# Patient Record
Sex: Male | Born: 1960
Health system: Southern US, Community
[De-identification: ages and names within clinical notes are randomized; demographics above are authoritative.]

## PROBLEM LIST (undated history)

## (undated) DIAGNOSIS — L0390 Cellulitis, unspecified: Secondary | ICD-10-CM

## (undated) DIAGNOSIS — C801 Malignant (primary) neoplasm, unspecified: Secondary | ICD-10-CM

## (undated) DIAGNOSIS — R748 Abnormal levels of other serum enzymes: Secondary | ICD-10-CM

## (undated) DIAGNOSIS — I82409 Acute embolism and thrombosis of unspecified deep veins of unspecified lower extremity: Secondary | ICD-10-CM

## (undated) DIAGNOSIS — R42 Dizziness and giddiness: Secondary | ICD-10-CM

## (undated) DIAGNOSIS — R011 Cardiac murmur, unspecified: Secondary | ICD-10-CM

## (undated) DIAGNOSIS — J189 Pneumonia, unspecified organism: Secondary | ICD-10-CM

## (undated) DIAGNOSIS — H269 Unspecified cataract: Secondary | ICD-10-CM

## (undated) DIAGNOSIS — H33319 Horseshoe tear of retina without detachment, unspecified eye: Secondary | ICD-10-CM

## (undated) DIAGNOSIS — N390 Urinary tract infection, site not specified: Secondary | ICD-10-CM

## (undated) DIAGNOSIS — N200 Calculus of kidney: Secondary | ICD-10-CM

## (undated) DIAGNOSIS — IMO0002 Reserved for concepts with insufficient information to code with codable children: Secondary | ICD-10-CM

## (undated) DIAGNOSIS — H409 Unspecified glaucoma: Secondary | ICD-10-CM

## (undated) HISTORY — DX: Horseshoe tear of retina without detachment, unspecified eye: H33.319

## (undated) HISTORY — DX: Unspecified glaucoma: H40.9

## (undated) HISTORY — DX: Acute embolism and thrombosis of unspecified deep veins of unspecified lower extremity: I82.409

## (undated) HISTORY — DX: Reserved for concepts with insufficient information to code with codable children: IMO0002

## (undated) HISTORY — DX: Urinary tract infection, site not specified: N39.0

## (undated) HISTORY — DX: Unspecified cataract: H26.9

## (undated) HISTORY — PX: EYE SURGERY: SHX253

## (undated) HISTORY — PX: TONSILLECTOMY: SUR1361

## (undated) HISTORY — DX: Dizziness and giddiness: R42

## (undated) HISTORY — DX: Calculus of kidney: N20.0

## (undated) HISTORY — DX: Cellulitis, unspecified: L03.90

## (undated) HISTORY — PX: RETINAL TEAR REPAIR CRYOTHERAPY: SHX5304

---
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
## 2000-12-24 ENCOUNTER — Encounter: Payer: Self-pay | Admitting: Emergency Medicine

## 2000-12-24 ENCOUNTER — Emergency Department (HOSPITAL_COMMUNITY): Admission: EM | Admit: 2000-12-24 | Discharge: 2000-12-24 | Payer: Self-pay | Admitting: Emergency Medicine

## 2001-01-22 ENCOUNTER — Encounter: Payer: Self-pay | Admitting: Emergency Medicine

## 2001-01-22 ENCOUNTER — Emergency Department (HOSPITAL_COMMUNITY): Admission: EM | Admit: 2001-01-22 | Discharge: 2001-01-22 | Payer: Self-pay | Admitting: Emergency Medicine

## 2002-04-08 ENCOUNTER — Encounter (HOSPITAL_BASED_OUTPATIENT_CLINIC_OR_DEPARTMENT_OTHER): Admission: RE | Admit: 2002-04-08 | Discharge: 2002-07-07 | Payer: Self-pay | Admitting: Internal Medicine

## 2002-07-08 ENCOUNTER — Encounter (HOSPITAL_BASED_OUTPATIENT_CLINIC_OR_DEPARTMENT_OTHER): Admission: RE | Admit: 2002-07-08 | Discharge: 2002-10-06 | Payer: Self-pay

## 2002-11-21 ENCOUNTER — Emergency Department (HOSPITAL_COMMUNITY): Admission: EM | Admit: 2002-11-21 | Discharge: 2002-11-21 | Payer: Self-pay | Admitting: Emergency Medicine

## 2003-04-23 ENCOUNTER — Encounter
Admission: RE | Admit: 2003-04-23 | Discharge: 2003-07-22 | Payer: Self-pay | Admitting: Physical Medicine & Rehabilitation

## 2004-04-13 ENCOUNTER — Encounter
Admission: RE | Admit: 2004-04-13 | Discharge: 2004-07-12 | Payer: Self-pay | Admitting: Physical Medicine & Rehabilitation

## 2004-05-05 ENCOUNTER — Other Ambulatory Visit: Payer: Self-pay

## 2004-05-09 ENCOUNTER — Other Ambulatory Visit: Payer: Self-pay

## 2004-08-11 ENCOUNTER — Encounter
Admission: RE | Admit: 2004-08-11 | Discharge: 2004-10-30 | Payer: Self-pay | Admitting: Physical Medicine & Rehabilitation

## 2004-08-14 ENCOUNTER — Ambulatory Visit: Payer: Self-pay | Admitting: Physical Medicine & Rehabilitation

## 2004-10-30 ENCOUNTER — Encounter
Admission: RE | Admit: 2004-10-30 | Discharge: 2005-01-25 | Payer: Self-pay | Admitting: Physical Medicine & Rehabilitation

## 2005-01-25 ENCOUNTER — Encounter
Admission: RE | Admit: 2005-01-25 | Discharge: 2005-04-25 | Payer: Self-pay | Admitting: Physical Medicine & Rehabilitation

## 2005-01-29 ENCOUNTER — Ambulatory Visit: Payer: Self-pay | Admitting: Physical Medicine & Rehabilitation

## 2005-04-23 ENCOUNTER — Encounter
Admission: RE | Admit: 2005-04-23 | Discharge: 2005-07-22 | Payer: Self-pay | Admitting: Physical Medicine & Rehabilitation

## 2005-04-23 ENCOUNTER — Ambulatory Visit: Payer: Self-pay | Admitting: Physical Medicine & Rehabilitation

## 2005-04-25 ENCOUNTER — Ambulatory Visit (HOSPITAL_COMMUNITY)
Admission: RE | Admit: 2005-04-25 | Discharge: 2005-04-25 | Payer: Self-pay | Admitting: Physical Medicine & Rehabilitation

## 2005-06-21 ENCOUNTER — Ambulatory Visit: Payer: Self-pay | Admitting: Physical Medicine & Rehabilitation

## 2005-09-04 ENCOUNTER — Encounter: Admission: RE | Admit: 2005-09-04 | Discharge: 2005-09-04 | Payer: Self-pay | Admitting: Internal Medicine

## 2005-09-07 ENCOUNTER — Ambulatory Visit (HOSPITAL_COMMUNITY): Admission: RE | Admit: 2005-09-07 | Discharge: 2005-09-07 | Payer: Self-pay | Admitting: Neurosurgery

## 2005-12-10 ENCOUNTER — Encounter
Admission: RE | Admit: 2005-12-10 | Discharge: 2006-03-10 | Payer: Self-pay | Admitting: Physical Medicine & Rehabilitation

## 2005-12-10 ENCOUNTER — Ambulatory Visit: Payer: Self-pay | Admitting: Physical Medicine & Rehabilitation

## 2006-01-21 ENCOUNTER — Ambulatory Visit: Payer: Self-pay | Admitting: Physical Medicine & Rehabilitation

## 2006-04-22 ENCOUNTER — Ambulatory Visit: Payer: Self-pay | Admitting: Physical Medicine & Rehabilitation

## 2006-04-22 ENCOUNTER — Encounter
Admission: RE | Admit: 2006-04-22 | Discharge: 2006-07-21 | Payer: Self-pay | Admitting: Physical Medicine & Rehabilitation

## 2006-05-24 ENCOUNTER — Ambulatory Visit: Payer: Self-pay | Admitting: Physical Medicine & Rehabilitation

## 2006-09-16 ENCOUNTER — Ambulatory Visit: Payer: Self-pay | Admitting: Physical Medicine & Rehabilitation

## 2006-09-16 ENCOUNTER — Encounter
Admission: RE | Admit: 2006-09-16 | Discharge: 2006-12-15 | Payer: Self-pay | Admitting: Physical Medicine & Rehabilitation

## 2006-12-13 ENCOUNTER — Ambulatory Visit: Payer: Self-pay | Admitting: Physical Medicine & Rehabilitation

## 2006-12-13 ENCOUNTER — Encounter
Admission: RE | Admit: 2006-12-13 | Discharge: 2007-03-13 | Payer: Self-pay | Admitting: Physical Medicine & Rehabilitation

## 2007-03-28 ENCOUNTER — Encounter
Admission: RE | Admit: 2007-03-28 | Discharge: 2007-06-26 | Payer: Self-pay | Admitting: Physical Medicine & Rehabilitation

## 2007-03-28 ENCOUNTER — Ambulatory Visit: Payer: Self-pay | Admitting: Physical Medicine & Rehabilitation

## 2007-08-06 ENCOUNTER — Ambulatory Visit: Payer: Self-pay | Admitting: Physical Medicine & Rehabilitation

## 2007-08-06 ENCOUNTER — Encounter
Admission: RE | Admit: 2007-08-06 | Discharge: 2007-08-08 | Payer: Self-pay | Admitting: Physical Medicine & Rehabilitation

## 2008-08-19 ENCOUNTER — Encounter
Admission: RE | Admit: 2008-08-19 | Discharge: 2008-08-20 | Payer: Self-pay | Admitting: Physical Medicine & Rehabilitation

## 2008-08-20 ENCOUNTER — Ambulatory Visit: Payer: Self-pay | Admitting: Physical Medicine & Rehabilitation

## 2010-01-16 ENCOUNTER — Encounter
Admission: RE | Admit: 2010-01-16 | Discharge: 2010-01-18 | Payer: Self-pay | Admitting: Physical Medicine & Rehabilitation

## 2010-01-18 ENCOUNTER — Ambulatory Visit: Payer: Self-pay | Admitting: Physical Medicine & Rehabilitation

## 2010-02-09 ENCOUNTER — Encounter: Admission: RE | Admit: 2010-02-09 | Discharge: 2010-05-10 | Payer: Self-pay | Admitting: Internal Medicine

## 2011-02-05 ENCOUNTER — Ambulatory Visit: Payer: Self-pay | Admitting: Physical Medicine & Rehabilitation

## 2011-02-14 ENCOUNTER — Encounter: Payer: Medicare Other | Attending: Physical Medicine & Rehabilitation

## 2011-02-14 ENCOUNTER — Ambulatory Visit: Payer: Medicare Other | Admitting: Physical Medicine & Rehabilitation

## 2011-02-14 DIAGNOSIS — M542 Cervicalgia: Secondary | ICD-10-CM | POA: Insufficient documentation

## 2011-02-14 DIAGNOSIS — M7710 Lateral epicondylitis, unspecified elbow: Secondary | ICD-10-CM

## 2011-02-14 DIAGNOSIS — X58XXXS Exposure to other specified factors, sequela: Secondary | ICD-10-CM | POA: Insufficient documentation

## 2011-02-14 DIAGNOSIS — I959 Hypotension, unspecified: Secondary | ICD-10-CM | POA: Insufficient documentation

## 2011-02-14 DIAGNOSIS — S14115A Complete lesion at C5 level of cervical spinal cord, initial encounter: Secondary | ICD-10-CM

## 2011-02-14 DIAGNOSIS — H269 Unspecified cataract: Secondary | ICD-10-CM | POA: Insufficient documentation

## 2011-02-14 DIAGNOSIS — IMO0002 Reserved for concepts with insufficient information to code with codable children: Secondary | ICD-10-CM | POA: Insufficient documentation

## 2011-02-14 DIAGNOSIS — G5620 Lesion of ulnar nerve, unspecified upper limb: Secondary | ICD-10-CM

## 2011-03-19 NOTE — Assessment & Plan Note (Signed)
He is back regarding his prior spinal cord injury and annual followup. He is generally been doing pretty well.  He had concerns about his blood pressure that has been lower over the last few months.  His blood pressure today was 77/42.  He has had no changes in his medications.  He has no complaints of dizziness or lightheadedness.  Dr. Chilton Si has checked him out as well.  His elbows have been generally stable, and he works on position changes and postural adjustments.  He has got a new power wheelchair.  Since I last saw him, he has had a lot of problems with the motor and other issues with his chair.  He had a complaint of some chest pain for a period of time that seemed to be backing off a bit.  He said Dr. Chilton Si felt it may be related to a sternal/rib problem. He was given instructions to use some heat and Tylenol.  He feels that it has gotten better over the last few months.  REVIEW OF SYSTEMS:  Notable for the above.  Full 12-point review is in the written health and history section of the chart.  The patient does follow up regularly with Dr. McDiarmid for renal ultrasound.  SOCIAL HISTORY:  The patient is single, living with his brother.  PHYSICAL EXAMINATION:  Blood pressure is 77/42, pulse 96, respiratory rate 18, satting 96% on room air.  The patient is pleasant, alert, and oriented x3.  Affect showing bright and appropriate.  Posture is fair. Sitting with some recline in his power chair.  He has still poor truncal control.  I palpated around his sternum and into the manubrium, and there was some general tenderness, but it was not severe.  He remains impaired to light touch below the level of his injury, but he seems to have some sparing along the chest.  Tone is still under good control, and there is no resting tone appreciated.  Reflexes are 2+.  ASSESSMENT: 1. C6 spinal cord injury, complete. 2. Right lateral epicondylitis and bilateral ulnar nerve irritation. 3.  Cataracts. 4. Cervicalgia history, which is likely postural and myofascial in     nature more likely.  No consistent nerve signs today. 5. Hypotension related to cord injury.  PLAN: 1. I reassured the patient that blood pressure was normal.  If he     should experience any dizziness or lightheadedness, then we could     look for other sources.  I did encourage him to drink plenty of     fluids. 2. Regular followup as scheduled with Urology. 3. Recommended ice and ibuprofen as needed for sternal pain.  This may     be a mild costochondritis.  It is improving regardless. 4. Lyrica, baclofen, and Valium on board. 5. We will see him back in about a year.  He will call me with any     problems or questions.     Ranelle Oyster, M.D. Electronically Signed    ZTS/MedQ D:  02/14/2011 14:20:14  T:  02/14/2011 22:33:13  Job #:  [id number]  cc:   Erskine Speed, M.D. Fax: [phone]
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
## 2011-04-03 NOTE — Assessment & Plan Note (Signed)
HISTORY:  [name] is here on 04/01/2007 regarding his spinal cord  injury.  He received a new left sided elbow brace for protection of the  elbow and assist with transfers.  He is still having some struggles with  it as he finds it a bit bulky and heavy at times.  It is a hinged brace.  He denies any increase in his pain symptoms in the elbows or hands.  We  last performed nerve conduction study on him in July of 2007, which  revealed ulnar neuropathy bilaterally.  Patient rates his discomfort at  a 3 out of 10.  Continues on his Lyrica, baclofen and valium for pain  and spasticity control.  No other changes have been reported.   REVIEW OF SYSTEMS:  There is no specific changes noted today.  Full  review is in the ___________ section.   SOCIAL HISTORY:  Patient is single and living with is brother.   PHYSICAL EXAMINATION:  VITAL SIGNS:  Blood pressure 82/33, pulse 92,  respiratory rate 16, he is on 94% on room air.  GENERAL:  Patient is generally pleasant in no acute distress.  MUSCULOSKELETAL:  He has some hypersensitivity on the elbows.  Rotator  cuff maneuvers were equivocal on both shoulders.  He has a head forward  posture with significant flexion of the thoracic and lower cervical  spine.  Skin is generally intact.  Brace allows full flexion and  extension in an AP plane.  NEUROLOGICAL:  Generally stable related to his C6 injury.   ASSESSMENT:  1. Status post C6 spinal cord injury.  2. Right lateral epicondylitis.  3. Bilateral ulnar neuropathy at the elbows.  4. History of cataracts.   PLAN:  1. Continue orthotics, range of motion and stretching.  I see no major      differences today in his exam.  We will hold off on nerve      conduction studies until the fall.  2. Continue Lyrica, baclofen and valium as he has been doing.  3. Recommended head rest or back rest extension to improve sitting      posture.  Some of his shoulder and arm pain may be affected by his  poor posture that is worsens during the day due to truncal      weakness.  4. I will see him back in September.      Ranelle Oyster, M.D.  Electronically Signed     ZTS/MedQ  D:  04/01/2007 11:38:38  T:  04/01/2007 12:11:46  Job #:  [id number]   cc:   Elmore Guise, MD  fax: [phone]
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
## 2012-02-06 ENCOUNTER — Encounter: Payer: Medicare Other | Attending: Physical Medicine & Rehabilitation | Admitting: Physical Medicine & Rehabilitation

## 2012-02-06 ENCOUNTER — Encounter: Payer: Self-pay | Admitting: Physical Medicine & Rehabilitation

## 2012-02-06 VITALS — BP 80/40 | HR 71 | Resp 16 | Ht 72.0 in | Wt 160.0 lb

## 2012-02-06 DIAGNOSIS — M7711 Lateral epicondylitis, right elbow: Secondary | ICD-10-CM

## 2012-02-06 DIAGNOSIS — R209 Unspecified disturbances of skin sensation: Secondary | ICD-10-CM | POA: Insufficient documentation

## 2012-02-06 DIAGNOSIS — S14106A Unspecified injury at C6 level of cervical spinal cord, initial encounter: Secondary | ICD-10-CM

## 2012-02-06 DIAGNOSIS — M75101 Unspecified rotator cuff tear or rupture of right shoulder, not specified as traumatic: Secondary | ICD-10-CM

## 2012-02-06 DIAGNOSIS — S5400XA Injury of ulnar nerve at forearm level, unspecified arm, initial encounter: Secondary | ICD-10-CM

## 2012-02-06 DIAGNOSIS — S14105A Unspecified injury at C5 level of cervical spinal cord, initial encounter: Secondary | ICD-10-CM

## 2012-02-06 DIAGNOSIS — K592 Neurogenic bowel, not elsewhere classified: Secondary | ICD-10-CM | POA: Insufficient documentation

## 2012-02-06 DIAGNOSIS — S5401XA Injury of ulnar nerve at forearm level, right arm, initial encounter: Secondary | ICD-10-CM

## 2012-02-06 DIAGNOSIS — N319 Neuromuscular dysfunction of bladder, unspecified: Secondary | ICD-10-CM | POA: Insufficient documentation

## 2012-02-06 DIAGNOSIS — M7710 Lateral epicondylitis, unspecified elbow: Secondary | ICD-10-CM

## 2012-02-06 DIAGNOSIS — IMO0002 Reserved for concepts with insufficient information to code with codable children: Secondary | ICD-10-CM | POA: Insufficient documentation

## 2012-02-06 DIAGNOSIS — M67919 Unspecified disorder of synovium and tendon, unspecified shoulder: Secondary | ICD-10-CM | POA: Insufficient documentation

## 2012-02-06 DIAGNOSIS — X58XXXS Exposure to other specified factors, sequela: Secondary | ICD-10-CM | POA: Insufficient documentation

## 2012-02-06 DIAGNOSIS — M719 Bursopathy, unspecified: Secondary | ICD-10-CM | POA: Insufficient documentation

## 2012-02-06 MED ORDER — DICLOFENAC SODIUM 1 % TD GEL: 1.0000 "application " | Freq: Three times a day (TID) | TRANSDERMAL | Status: DC

## 2012-02-06 NOTE — Progress Notes (Signed)
  Subjective:    Patient ID: Gary Burch, male    DOB: 12/14/1960, 51 y.o.   MRN: [id number]  HPI Mr. Pavey is back regarding his C6 spinal cord injury.  Bowel and bladder are stable. He just saw Dr. Iona Coach. Right elbow remains problematic.  He has a lot of the tingling and burning still especially when he's asleep.  Shoulder has bothered him at night and in the morning when he first awakens.  It bothers over the lateral shoulder and posteriorly.   Pain Inventory Average Pain 4 Pain Right Now 3 My pain is constant, sharp, burning and aching  In the last 24 hours, has pain interfered with the following? General activity 3 Relation with others 2 Enjoyment of life 3 What TIME of day is your pain at its worst? morning Sleep (in general) Fair  Pain is worse with: some activites Pain improves with: rest and heat/ice Relief from Meds: 2  Mobility use a wheelchair  Function disabled: date disabled 7 I need assistance with the following:  dressing, bathing, toileting, meal prep, household duties and shopping  Neuro/Psych spasms and issues related to quadraplegia  Prior Studies Any changes since last visit?  no  Physicians involved in your care Any changes since last visit?  no Primary care Dr Nila Nephew Dr Lorin Picket McDarmid Urologist      Review of Systems  All other systems reviewed and are negative.       Objective:   Physical Exam  Constitutional: He is oriented to person, place, and time. He appears well-developed and well-nourished.  HENT:  Head: Normocephalic and atraumatic.  Eyes: Conjunctivae and EOM are normal.  Cardiovascular: Normal rate.   Pulmonary/Chest: Effort normal and breath sounds normal.  Abdominal: Soft.  Musculoskeletal:       He has mild pain in the right shoulder with anterior palpation and posterior palpation rotator cuff maneuvers were equivocal or positive.  He has pain at the medial and lateral epicondyles of the elbow. Tinel's  test is positive at the medial condyle/ulnar groove.  Neurological: He is alert and oriented to person, place, and time.       Exam is unchanged with weakness below the C6 level. He is sensory complete. Reflexes are 2+.  Psychiatric: He has a normal mood and affect. His behavior is normal. Judgment and thought content normal.          Assessment & Plan:   1. C6 spinal cord injury, complete. 2. Right lateral epicondylitis with ulnar nerve irritation 3. Right rotator cuff syndrome 4. Neurogenic bowel and bladder  Plan: 1. We'll try voltaren gel for his right shoulder and elbow. 2. Discussed adaptive sleeping positions. He really needs to try to stay off of his side which will irritate both his shoulder and the elbow. 3. Discussed using heat elbow pad at nighttime perhaps 1 it's a bit stiffer to keep his arm in more of an extended position. 4. I'll see the patient back in about 12 months 5. Maintain current bowel and bladder schedule. He seems to be doing well there.

## 2012-02-06 NOTE — Patient Instructions (Signed)
Consider elbow pad for use at night while you sleep.  Modify sleeping position as possible to where your more supine

## 2013-01-17 DIAGNOSIS — L0390 Cellulitis, unspecified: Secondary | ICD-10-CM

## 2013-01-17 HISTORY — DX: Cellulitis, unspecified: L03.90

## 2013-02-09 ENCOUNTER — Encounter: Payer: Medicare Other | Attending: Physical Medicine & Rehabilitation | Admitting: Physical Medicine & Rehabilitation

## 2013-02-09 ENCOUNTER — Encounter: Payer: Self-pay | Admitting: Physical Medicine & Rehabilitation

## 2013-02-09 ENCOUNTER — Other Ambulatory Visit: Payer: Self-pay | Admitting: *Deleted

## 2013-02-09 VITALS — BP 103/62 | HR 57 | Resp 15 | Ht 72.0 in | Wt 170.0 lb

## 2013-02-09 DIAGNOSIS — X58XXXS Exposure to other specified factors, sequela: Secondary | ICD-10-CM | POA: Insufficient documentation

## 2013-02-09 DIAGNOSIS — N319 Neuromuscular dysfunction of bladder, unspecified: Secondary | ICD-10-CM | POA: Insufficient documentation

## 2013-02-09 DIAGNOSIS — K592 Neurogenic bowel, not elsewhere classified: Secondary | ICD-10-CM | POA: Insufficient documentation

## 2013-02-09 DIAGNOSIS — M719 Bursopathy, unspecified: Secondary | ICD-10-CM | POA: Insufficient documentation

## 2013-02-09 DIAGNOSIS — M7710 Lateral epicondylitis, unspecified elbow: Secondary | ICD-10-CM | POA: Insufficient documentation

## 2013-02-09 DIAGNOSIS — S5401XD Injury of ulnar nerve at forearm level, right arm, subsequent encounter: Secondary | ICD-10-CM

## 2013-02-09 DIAGNOSIS — M75101 Unspecified rotator cuff tear or rupture of right shoulder, not specified as traumatic: Secondary | ICD-10-CM

## 2013-02-09 DIAGNOSIS — L89209 Pressure ulcer of unspecified hip, unspecified stage: Secondary | ICD-10-CM | POA: Insufficient documentation

## 2013-02-09 DIAGNOSIS — IMO0002 Reserved for concepts with insufficient information to code with codable children: Secondary | ICD-10-CM | POA: Insufficient documentation

## 2013-02-09 DIAGNOSIS — L8990 Pressure ulcer of unspecified site, unspecified stage: Secondary | ICD-10-CM | POA: Insufficient documentation

## 2013-02-09 DIAGNOSIS — Z5189 Encounter for other specified aftercare: Secondary | ICD-10-CM

## 2013-02-09 DIAGNOSIS — R209 Unspecified disturbances of skin sensation: Secondary | ICD-10-CM | POA: Insufficient documentation

## 2013-02-09 DIAGNOSIS — M7711 Lateral epicondylitis, right elbow: Secondary | ICD-10-CM

## 2013-02-09 DIAGNOSIS — M67919 Unspecified disorder of synovium and tendon, unspecified shoulder: Secondary | ICD-10-CM | POA: Insufficient documentation

## 2013-02-09 DIAGNOSIS — S14106D Unspecified injury at C6 level of cervical spinal cord, subsequent encounter: Secondary | ICD-10-CM

## 2013-02-09 MED ORDER — TRAMADOL HCL 50 MG PO TABS: 25.0000 mg | ORAL_TABLET | Freq: Four times a day (QID) | ORAL | Status: DC | PRN

## 2013-02-09 MED ORDER — DOXAZOSIN MESYLATE 4 MG PO TABS: 6.0000 mg | ORAL_TABLET | Freq: Every day | ORAL | Status: DC

## 2013-02-09 NOTE — Patient Instructions (Signed)
RECORD YOUR URINE VOLUMES THROUGH THE CONDOM CATHETER AND VIA THE STRAIGHT CATHS. SEE IF THE INCREASE IN CARDURA LOWERS YOUR CATH AMOUNTS AND INCREASES THE VOLUME COLLECTED VIA THE CONDOM CATH

## 2013-02-09 NOTE — Progress Notes (Signed)
Subjective:    Patient ID: Gary Burch, male    DOB: [birth year], 52 y.o.   MRN: [id number]  HPI  Mr. Holroyd is back regarding his C6 SCI. Over the last couple months he's had problems with UTI's, the last of which was resistant to oral abx. He also developed a cellulitis which he just completed abx for. Also, he devloped a pressure sore on his right trochanter from side lying too much on it.   He complains of increased tingling in his hand/arm as well over this period of time. His symptoms tend to be more severe at night and first thing in the morning. They will often feel better once he's up a few hours.   Right now he is cathing twice daily and has a condom cath the other parts of the day. His cath volumes have been around 700 typically.    Pain Inventory Average Pain 4 Pain Right Now 2 My pain is intermittent and sharp  In the last 24 hours, has pain interfered with the following? General activity 3 Relation with others 1 Enjoyment of life 2 What TIME of day is your pain at its worst? morning Sleep (in general) n/a  Pain is worse with: other:sleep Pain improves with: n/a Relief from Meds: 2  Mobility do you drive?  no use a wheelchair needs help with transfers  Function not employed: date last employed 1981 disabled: date disabled 73 I need assistance with the following:  dressing, bathing, toileting, meal prep, household duties and shopping  Neuro/Psych No problems in this area  Prior Studies Any changes since last visit?  no  Physicians involved in your care Any changes since last visit?  no   History reviewed. No pertinent family history. History   Social History  . Marital Status: Single    Spouse Name: N/A    Number of Children: N/A  . Years of Education: N/A   Social History Main Topics  . Smoking status: Never Smoker   . Smokeless tobacco: Never Used  . Alcohol Use: None  . Drug Use: None  . Sexually Active: None   Other Topics Concern   . None   Social History Narrative  . None   Past Surgical History  Procedure Laterality Date  . Eye surgery      bilateral cataract  . Spine surgery  1981    fusion   Past Medical History  Diagnosis Date  . Spine injury   . Cellulitis 01-2013    left leg   BP 103/62  Pulse 57  Resp 15  Ht 6' (1.829 m)  Wt 170 lb (77.111 kg)  BMI 23.05 kg/m2  SpO2 99%      Review of Systems  Skin: Positive for rash.  All other systems reviewed and are negative.       Objective:   Physical Exam Constitutional: He is oriented to person, place, and time. He appears well-developed and well-nourished.  HENT:  Head: Normocephalic and atraumatic.  Eyes: Conjunctivae and EOM are normal.  Cardiovascular: Normal rate.  Pulmonary/Chest: Effort normal and breath sounds normal.  Abdominal: Soft.  Musculoskeletal:  He has mild pain in the right shoulder with anterior palpation and posterior palpation rotator cuff maneuvers were equivocal or positive.  He has pain at the medial and lateral epicondyles of the elbow. Tinel's test is positive at the medial condyle/ulnar groove.  Neurological: He is alert and oriented to person, place, and time.  Exam is unchanged with weakness below  the C6 level. He is sensory complete. Reflexes are 2+.  Psychiatric: He has a normal mood and affect. His behavior is normal. Judgment and thought content normal.    Assessment & Plan:   1. C6 spinal cord injury, complete.  2. Right lateral epicondylitis with ulnar nerve irritation  3. Right rotator cuff syndrome  4. Neurogenic bowel and bladder  Plan:  1. Will add tramadol for breakthrough pain.  2. Observe for effect of new cushion bed. Discussed the possibility of performing more nerve tests on his UE's if symptoms progress. However, given his injury level and body habitus, I'm not sure how much a decompression would change things.  3. Discussed again using heat elbow pad at nighttime perhaps 1 it's a bit  stiffer to keep his arm in more of an extended position.  4. I'll see the patient back in about 12 months  5. Increased cardura for urine retention to 6mg  daily. Needs to be careful to watch for further hypotension as he doesn't have much room to give. We discussed close observation of urine volumes and the symptoms to look out for regarding a UTI. If he has further problems, he should follow up with urology.

## 2013-03-11 ENCOUNTER — Encounter (HOSPITAL_BASED_OUTPATIENT_CLINIC_OR_DEPARTMENT_OTHER): Payer: Medicare Other | Attending: General Surgery

## 2013-03-11 DIAGNOSIS — Z792 Long term (current) use of antibiotics: Secondary | ICD-10-CM | POA: Insufficient documentation

## 2013-03-11 DIAGNOSIS — L8990 Pressure ulcer of unspecified site, unspecified stage: Secondary | ICD-10-CM | POA: Insufficient documentation

## 2013-03-11 DIAGNOSIS — Z79899 Other long term (current) drug therapy: Secondary | ICD-10-CM | POA: Insufficient documentation

## 2013-03-11 DIAGNOSIS — L89209 Pressure ulcer of unspecified hip, unspecified stage: Secondary | ICD-10-CM | POA: Insufficient documentation

## 2013-03-11 DIAGNOSIS — Z981 Arthrodesis status: Secondary | ICD-10-CM | POA: Insufficient documentation

## 2013-03-11 DIAGNOSIS — G8250 Quadriplegia, unspecified: Secondary | ICD-10-CM | POA: Insufficient documentation

## 2013-03-11 DIAGNOSIS — L89109 Pressure ulcer of unspecified part of back, unspecified stage: Secondary | ICD-10-CM | POA: Insufficient documentation

## 2013-03-11 DIAGNOSIS — L89509 Pressure ulcer of unspecified ankle, unspecified stage: Secondary | ICD-10-CM | POA: Insufficient documentation
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
## 2013-03-24 ENCOUNTER — Encounter: Payer: Self-pay | Admitting: Internal Medicine

## 2013-03-24 ENCOUNTER — Encounter: Payer: Self-pay | Admitting: *Deleted

## 2013-03-24 NOTE — Telephone Encounter (Signed)
Error

## 2013-03-24 NOTE — Telephone Encounter (Signed)
Returning message from [place] - advanced home care nurse requesting orders for catheters? This encounter was created in error - please disregard.

## 2013-03-25 ENCOUNTER — Encounter (HOSPITAL_BASED_OUTPATIENT_CLINIC_OR_DEPARTMENT_OTHER): Payer: Medicare Other | Attending: General Surgery

## 2013-03-25 ENCOUNTER — Ambulatory Visit (HOSPITAL_COMMUNITY)
Admission: RE | Admit: 2013-03-25 | Discharge: 2013-03-25 | Disposition: A | Discharge status: Home / Self Care | Payer: Medicare Other | Source: Ambulatory Visit | Attending: General Surgery | Admitting: General Surgery

## 2013-03-25 ENCOUNTER — Other Ambulatory Visit (HOSPITAL_BASED_OUTPATIENT_CLINIC_OR_DEPARTMENT_OTHER): Payer: Self-pay | Admitting: General Surgery

## 2013-03-25 DIAGNOSIS — L8992 Pressure ulcer of unspecified site, stage 2: Secondary | ICD-10-CM | POA: Insufficient documentation

## 2013-03-25 DIAGNOSIS — L8993 Pressure ulcer of unspecified site, stage 3: Secondary | ICD-10-CM | POA: Insufficient documentation

## 2013-03-25 DIAGNOSIS — L89509 Pressure ulcer of unspecified ankle, unspecified stage: Secondary | ICD-10-CM | POA: Insufficient documentation

## 2013-03-25 DIAGNOSIS — L89109 Pressure ulcer of unspecified part of back, unspecified stage: Secondary | ICD-10-CM | POA: Insufficient documentation

## 2013-03-25 DIAGNOSIS — L8990 Pressure ulcer of unspecified site, unspecified stage: Secondary | ICD-10-CM | POA: Insufficient documentation

## 2013-03-25 DIAGNOSIS — G8929 Other chronic pain: Secondary | ICD-10-CM | POA: Insufficient documentation

## 2013-03-25 DIAGNOSIS — M25579 Pain in unspecified ankle and joints of unspecified foot: Secondary | ICD-10-CM | POA: Insufficient documentation

## 2013-03-25 DIAGNOSIS — M25572 Pain in left ankle and joints of left foot: Secondary | ICD-10-CM

## 2013-04-29 ENCOUNTER — Encounter (HOSPITAL_BASED_OUTPATIENT_CLINIC_OR_DEPARTMENT_OTHER): Payer: Medicare Other | Attending: General Surgery

## 2013-04-29 DIAGNOSIS — L89509 Pressure ulcer of unspecified ankle, unspecified stage: Secondary | ICD-10-CM | POA: Insufficient documentation

## 2013-04-29 DIAGNOSIS — L8993 Pressure ulcer of unspecified site, stage 3: Secondary | ICD-10-CM | POA: Insufficient documentation

## 2013-05-11 ENCOUNTER — Other Ambulatory Visit: Payer: Self-pay | Admitting: Physical Medicine & Rehabilitation

## 2013-05-20 ENCOUNTER — Encounter (HOSPITAL_BASED_OUTPATIENT_CLINIC_OR_DEPARTMENT_OTHER): Payer: Medicare Other | Attending: General Surgery

## 2013-05-20 DIAGNOSIS — L8993 Pressure ulcer of unspecified site, stage 3: Secondary | ICD-10-CM | POA: Insufficient documentation

## 2013-05-20 DIAGNOSIS — L89509 Pressure ulcer of unspecified ankle, unspecified stage: Secondary | ICD-10-CM | POA: Insufficient documentation

## 2013-11-25 ENCOUNTER — Other Ambulatory Visit: Payer: Self-pay | Admitting: Internal Medicine

## 2013-11-25 DIAGNOSIS — R51 Headache: Principal | ICD-10-CM

## 2013-11-25 DIAGNOSIS — R519 Headache, unspecified: Secondary | ICD-10-CM

## 2013-12-03 ENCOUNTER — Other Ambulatory Visit: Payer: Medicare Other

## 2013-12-04 ENCOUNTER — Ambulatory Visit
Admission: RE | Admit: 2013-12-04 | Discharge: 2013-12-04 | Disposition: A | Discharge status: Home / Self Care | Payer: Medicare Other | Source: Ambulatory Visit | Attending: Internal Medicine | Admitting: Internal Medicine

## 2013-12-04 DIAGNOSIS — R51 Headache: Principal | ICD-10-CM

## 2013-12-04 DIAGNOSIS — R519 Headache, unspecified: Secondary | ICD-10-CM

## 2013-12-22 ENCOUNTER — Other Ambulatory Visit: Payer: Self-pay | Admitting: Physical Medicine & Rehabilitation

## 2014-01-17 ENCOUNTER — Other Ambulatory Visit: Payer: Self-pay | Admitting: Physical Medicine & Rehabilitation

## 2014-01-25 ENCOUNTER — Telehealth: Payer: Self-pay

## 2014-01-25 MED ORDER — DOXAZOSIN MESYLATE 4 MG PO TABS
ORAL_TABLET | ORAL | Status: DC
Start: 2014-01-25 — End: 2014-02-18

## 2014-01-25 NOTE — Telephone Encounter (Signed)
yes

## 2014-01-25 NOTE — Telephone Encounter (Signed)
Patient is requesting a refill on Doxazosin. Is this okay?

## 2014-01-25 NOTE — Telephone Encounter (Signed)
Doxazosin refill sent to pharmacy. Patient is aware.

## 2014-02-08 ENCOUNTER — Encounter: Payer: Medicare Other | Admitting: Physical Medicine & Rehabilitation

## 2014-02-10 ENCOUNTER — Ambulatory Visit: Payer: Medicare Other | Admitting: Physical Medicine & Rehabilitation

## 2014-02-18 ENCOUNTER — Other Ambulatory Visit: Payer: Self-pay | Admitting: Physical Medicine & Rehabilitation

## 2014-03-17 ENCOUNTER — Encounter: Payer: Medicare Other | Attending: Physical Medicine & Rehabilitation | Admitting: Physical Medicine & Rehabilitation

## 2014-03-17 ENCOUNTER — Encounter: Payer: Self-pay | Admitting: Physical Medicine & Rehabilitation

## 2014-03-17 VITALS — BP 88/43 | HR 74 | Resp 14 | Ht 71.0 in | Wt 165.0 lb

## 2014-03-17 DIAGNOSIS — S5400XA Injury of ulnar nerve at forearm level, unspecified arm, initial encounter: Secondary | ICD-10-CM

## 2014-03-17 DIAGNOSIS — M7710 Lateral epicondylitis, unspecified elbow: Secondary | ICD-10-CM

## 2014-03-17 DIAGNOSIS — Z981 Arthrodesis status: Secondary | ICD-10-CM | POA: Insufficient documentation

## 2014-03-17 DIAGNOSIS — S14105A Unspecified injury at C5 level of cervical spinal cord, initial encounter: Secondary | ICD-10-CM

## 2014-03-17 DIAGNOSIS — M67919 Unspecified disorder of synovium and tendon, unspecified shoulder: Secondary | ICD-10-CM | POA: Insufficient documentation

## 2014-03-17 DIAGNOSIS — X58XXXS Exposure to other specified factors, sequela: Secondary | ICD-10-CM | POA: Insufficient documentation

## 2014-03-17 DIAGNOSIS — IMO0002 Reserved for concepts with insufficient information to code with codable children: Secondary | ICD-10-CM | POA: Insufficient documentation

## 2014-03-17 DIAGNOSIS — S14106A Unspecified injury at C6 level of cervical spinal cord, initial encounter: Secondary | ICD-10-CM

## 2014-03-17 DIAGNOSIS — K592 Neurogenic bowel, not elsewhere classified: Secondary | ICD-10-CM

## 2014-03-17 DIAGNOSIS — IMO0001 Reserved for inherently not codable concepts without codable children: Secondary | ICD-10-CM | POA: Insufficient documentation

## 2014-03-17 DIAGNOSIS — N319 Neuromuscular dysfunction of bladder, unspecified: Secondary | ICD-10-CM

## 2014-03-17 DIAGNOSIS — M719 Bursopathy, unspecified: Secondary | ICD-10-CM | POA: Insufficient documentation

## 2014-03-17 DIAGNOSIS — M2550 Pain in unspecified joint: Secondary | ICD-10-CM | POA: Insufficient documentation

## 2014-03-17 DIAGNOSIS — S5401XA Injury of ulnar nerve at forearm level, right arm, initial encounter: Secondary | ICD-10-CM

## 2014-03-17 DIAGNOSIS — M7711 Lateral epicondylitis, right elbow: Secondary | ICD-10-CM

## 2014-03-17 NOTE — Patient Instructions (Signed)
PLEASE CALL ME WITH ANY PROBLEMS OR QUESTIONS (#[phone]).      

## 2014-03-17 NOTE — Progress Notes (Signed)
Subjective:    Patient ID: Gary Burch, male    DOB: 1960-12-01, 53 y.o.   MRN: [id number]  HPI  Gary Burch is back regarding his C6 SCI. He's had some ongoing issues with his shoulder and elbows.  He is using makeshift pads on his wheelchair to pad his elbows.   His urinary volumes have been a little better. Typically they are around 600cc's. He had a renal u/s recently which was normal.   He noticed some headaches associated with his urinary caths and bowel program. He also developed a UTI. He hasn't had a headache since January. His bowel program is daily.   He also developed a retinal tear which was treated by Optho with laser treatment.     Pain Inventory Average Pain 2 Pain Right Now 2 My pain is intermittent, sharp, stabbing, tingling and aching  In the last 24 hours, has pain interfered with the following? General activity 3 Relation with others 2 Enjoyment of life 2 What TIME of day is your pain at its worst? daytime Sleep (in general) Fair  Pain is worse with: some activites Pain improves with: rest and heat/ice Relief from Meds: 3  Mobility ability to climb steps?  no do you drive?  no use a wheelchair needs help with transfers  Function not employed: date last employed n/a disabled: date disabled 50 I need assistance with the following:  dressing, bathing, toileting, meal prep, household duties and shopping Do you have any goals in this area?  no  Neuro/Psych No problems in this area  Prior Studies Any changes since last visit?  no  Physicians involved in your care Primary care Dr Zada Girt   History reviewed. No pertinent family history. History   Social History  . Marital Status: Single    Spouse Name: N/A    Number of Children: N/A  . Years of Education: N/A   Social History Main Topics  . Smoking status: Never Smoker   . Smokeless tobacco: Never Used  . Alcohol Use: None  . Drug Use: None  . Sexual Activity: None   Other  Topics Concern  . None   Social History Narrative  . None   Past Surgical History  Procedure Laterality Date  . Eye surgery      bilateral cataract  . Spine surgery  1981    fusion   Past Medical History  Diagnosis Date  . Spine injury   . Cellulitis 01-2013    left leg   BP 88/43  Pulse 74  Resp 14  Ht 5\' 11"  (1.803 m)  Wt 165 lb (74.844 kg)  BMI 23.02 kg/m2  SpO2 99%  Opioid Risk Score:   Fall Risk Score: Low Fall Risk (0-5 points) (patient educated handout declined)   Review of Systems  Musculoskeletal: Positive for arthralgias and myalgias.  All other systems reviewed and are negative.      Objective:   Physical Exam  Constitutional: He is oriented to person, place, and time. He appears well-developed and well-nourished.  HENT:  Head: Normocephalic and atraumatic.  Eyes: Conjunctivae and EOM are normal.  Cardiovascular: Normal rate.  Pulmonary/Chest: Effort normal and breath sounds normal.  Abdominal: Soft.  Musculoskeletal:  He has mild pain at the medial and lateral epicondyles of the elbow. Tinel's test is positive at the medial condyle/ulnar groove.  Neurological: He is alert and oriented to person, place, and time.  Exam is unchanged with weakness below the C6 level. He is  sensory complete. Reflexes are 3+. Sustained clonus noted in both ankles Psychiatric: He has a normal mood and affect. His behavior is normal. Judgment and thought content normal.  Assessment & Plan:   1. C6 spinal cord injury, complete.  2. Right lateral epicondylitis with ulnar nerve irritation  3. Right rotator cuff syndrome  4. Neurogenic bowel and bladder    Plan:  1. Tramadol for breakthrough pain (rarely uses).  2. Discussed rom, frequent re-positioning to help with various joint pain, skin breakdown, nerve compression.  3. Discussed again using heat elbow pad at nighttime perhaps 1 it's a bit stiffer to keep his arm in more of an extended position.  4. Recommended  regular softener to improve bowel program.   5. I suspect that his headaches are autonomic in origin. Aggressive management of his bowels, bladder, skin, etc was recommended.   I'll see him back in about a year. 15 minutes of face to face patient care time were spent during this visit. All questions were encouraged and answered.

## 2014-03-20 ENCOUNTER — Other Ambulatory Visit: Payer: Self-pay | Admitting: Physical Medicine & Rehabilitation

## 2014-04-16 ENCOUNTER — Other Ambulatory Visit: Payer: Self-pay | Admitting: Physical Medicine & Rehabilitation

## 2014-04-19 ENCOUNTER — Telehealth: Payer: Self-pay

## 2014-04-19 MED ORDER — DOXAZOSIN MESYLATE 4 MG PO TABS: ORAL_TABLET | ORAL | Status: DC

## 2014-04-19 NOTE — Telephone Encounter (Signed)
Patient request cardura refill.  Please advise.

## 2014-04-19 NOTE — Telephone Encounter (Signed)
done

## 2014-04-20 NOTE — Telephone Encounter (Signed)
Attempted to contact patient. Left a voicemail informing patient that the refill he requested has been sent to the pharmacy.

## 2014-09-24 ENCOUNTER — Other Ambulatory Visit: Payer: Self-pay | Admitting: *Deleted

## 2014-09-24 MED ORDER — DOXAZOSIN MESYLATE 4 MG PO TABS: ORAL_TABLET | ORAL | Status: DC

## 2014-09-24 NOTE — Telephone Encounter (Signed)
Rec'd fax, medication previously prescribed by Dr. Naaman Plummer, as a courtesy, medication was refilled electronically

## 2015-01-04 ENCOUNTER — Telehealth: Payer: Self-pay

## 2015-01-04 NOTE — Telephone Encounter (Signed)
-----   Message from Inda Castle, MD sent at 01/03/2015 12:29 PM EST ----- Please schedule this pt for an OV (with me or extender).  He is a quadaplegic that needs screening colonoscopy.  Sounds like he'll need an overnight admission to assist in his prep.

## 2015-01-04 NOTE — Telephone Encounter (Signed)
Spoke with the patient. He is a patient of Dr Nyoka Cowden. Patient reports he has had left sided abdominal pain. His bowel movements have changed to loose and difficult to evacuate. His PCP recommended he see GI. He is quadriplegic. He does not bring a care partner. He is concerned about trying to prep for a colonoscopy. He has a "neurological response" to bowel movements. As per Dr Deatra Ina an appointment is made.

## 2015-01-10 ENCOUNTER — Ambulatory Visit (INDEPENDENT_AMBULATORY_CARE_PROVIDER_SITE_OTHER): Payer: Medicare Other | Admitting: Physician Assistant

## 2015-01-10 ENCOUNTER — Other Ambulatory Visit (INDEPENDENT_AMBULATORY_CARE_PROVIDER_SITE_OTHER): Payer: Medicare Other

## 2015-01-10 ENCOUNTER — Encounter: Payer: Self-pay | Admitting: Physician Assistant

## 2015-01-10 VITALS — BP 90/50 | HR 60

## 2015-01-10 DIAGNOSIS — R1032 Left lower quadrant pain: Secondary | ICD-10-CM

## 2015-01-10 DIAGNOSIS — R194 Change in bowel habit: Secondary | ICD-10-CM

## 2015-01-10 LAB — CBC WITH DIFFERENTIAL/PLATELET
Basophils Absolute: 0 10*3/uL (ref 0.0–0.1)
Basophils Relative: 0.5 % (ref 0.0–3.0)
Eosinophils Absolute: 0.3 10*3/uL (ref 0.0–0.7)
Eosinophils Relative: 6.9 % — ABNORMAL HIGH (ref 0.0–5.0)
HCT: 41.3 % (ref 39.0–52.0)
Hemoglobin: 14.1 g/dL (ref 13.0–17.0)
Lymphocytes Relative: 21 % (ref 12.0–46.0)
Lymphs Abs: 1 10*3/uL (ref 0.7–4.0)
MCHC: 34 g/dL (ref 30.0–36.0)
MCV: 94.8 fl (ref 78.0–100.0)
Monocytes Absolute: 0.5 10*3/uL (ref 0.1–1.0)
Monocytes Relative: 10.8 % (ref 3.0–12.0)
NEUTROS PCT: 60.8 % (ref 43.0–77.0)
Neutro Abs: 2.9 10*3/uL (ref 1.4–7.7)
RBC: 4.36 Mil/uL (ref 4.22–5.81)
RDW: 13 % (ref 11.5–15.5)
WBC: 4.8 10*3/uL (ref 4.0–10.5)

## 2015-01-10 NOTE — Progress Notes (Signed)
Patient ID: Gary Burch, male   DOB: 15-May-1961, 54 y.o.   MRN: [id number]   Subjective:    Patient ID: Gary Burch, male    DOB: [birth year], 54 y.o.   MRN: [id number]  HPI Gary Burch is a pleasant 54 year old white male referred today by Dr. Zada Girt for evaluation of left lower quadrant pain patient has not had any prior GI evaluation. He is a quadriplegic with a remote C6 spinal cord injury. He has a neurogenic bowel and bladder and what he describes as dysautonomia. Patient states that he has been having pain in his left abdomen persistently over the past 4 months. Says he does have some sensation in his abdomen and can feel pressure. He describes this discomfort as a sharp pain is aware of it most of the time. He says is somewhat worse with sitting up. He has not noticed any change with by mouth intake and no change with bowel movements. His appetite is been fine his weight has been stable. He says his bowel movements have been somewhat softer and these had some more difficulty evacuating his bowels recently. He says he does a manual disimpaction each day as that has worked best for him. On a couple of occasions he seen a small amount of streaky bright red blood but none in the past couple of weeks. No fever or chills. No urinary symptoms and he does do in and out cath on a daily basis. No prior abdominal surgeries Family history positive for polyps in his father. Exline He has not had any recent labs or imaging. Patient says he has very difficult time whenever he has loose bowel movements or incontinence or diarrhea. He says these episodes are usually accompanied by chills diaphoresis and clamminess and weakness and he feels miserable.  Review of Systems Pertinent positive and negative review of systems were noted in the above HPI section.  All other review of systems was otherwise negative.  Outpatient Encounter Prescriptions as of 01/10/2015  Medication Sig  . ampicillin (PRINCIPEN) 500 MG  capsule   . baclofen (LIORESAL) 10 MG tablet Take 10 mg by mouth 4 (four) times daily.  . bethanechol (URECHOLINE) 25 MG tablet Take 50 mg by mouth 3 (three) times daily.  . Cetirizine-Pseudoephedrine (ZYRTEC-D PO) Take 1 tablet by mouth daily as needed.  . clindamycin-benzoyl peroxide (BENZACLIN) gel Apply topically 2 (two) times daily.  . Dapsone 5 % topical gel Apply topically 2 (two) times daily.  . diazepam (VALIUM) 2 MG tablet Take 2 mg by mouth every 8 (eight) hours as needed.   . diclofenac sodium (VOLTAREN) 1 % GEL Apply 1 application topically 3 (three) times daily.  Marland Kitchen doxazosin (CARDURA) 4 MG tablet TAKE 1 1/2 TABLETS BY MOUTH AT BEDTIME  . pregabalin (LYRICA) 150 MG capsule Take 150 mg by mouth 3 (three) times daily.  . tazarotene (TAZORAC) 0.1 % gel Apply topically at bedtime.  . traMADol (ULTRAM) 50 MG tablet Take 0.5-1 tablets (25-50 mg total) by mouth every 6 (six) hours as needed for pain.  . [DISCONTINUED] triamcinolone (NASACORT) 55 MCG/ACT nasal inhaler Place 2 sprays into the nose daily.   Allergies  Allergen Reactions  . Macrodantin   . Sulfa Antibiotics    Patient Active Problem List   Diagnosis Date Noted  . Neurogenic bladder 02/09/2013  . Neurogenic bowel 02/09/2013  . C6 spinal cord injury 02/06/2012  . Injury of ulnar nerve at right forearm level 02/06/2012  . Rotator cuff syndrome of  right shoulder 02/06/2012  . Right lateral epicondylitis 02/06/2012   History   Social History  . Marital Status: Single    Spouse Name: N/A  . Number of Children: N/A  . Years of Education: N/A   Occupational History  . Not on file.   Social History Main Topics  . Smoking status: Never Smoker   . Smokeless tobacco: Never Used  . Alcohol Use: 0.0 oz/week    0 Standard drinks or equivalent per week     Comment: occasional  . Drug Use: No  . Sexual Activity: Not on file   Other Topics Concern  . Not on file   Social History Narrative    Mr. Cecchi's family  history includes Colon polyps in his father.      Objective:    Filed Vitals:   01/10/15 1351  BP: 90/50  Pulse: 60    Physical Exam  well-developed  Thin white male in a wheelchair. Blood pressure 90/50 pulse 60 not weighed today nonambulatory, HEENT; nontraumatic normocephalic EOMI PERRLA sclera anicteric, Supple; no JVD, Cardiovascular; regular rate and rhythm with S1-S2 no murmur or gallop, Pulmonary; clear bilaterally, Abdomen; soft bowel sounds are present he is tender in the left lower quadrant there is no guarding or rebound no palpable mass or hepatosplenomegaly, Rectal ;exam not done patient is a quadriplegic, Extremities; quadriplegia,, muscular atrophy of upper extremities fingertips bluish Neuropsych; mood and affect ap propriate       Assessment & Plan:   #1 54 yo male quadriplegic, s/p  remote C-6 spinal cord injury with 4 month hx of LLQ abdominal pain. Etiology is unclear .  #2 mild change in bowel habits with softer stool #3 neurogenic bowel and bladder #4 dysautonomia-intolerance to diarrhea     Plan; Check CBC with differential, CMET Schedule for CT scan of the abdomen and and pelvis with contrast Patient will have a very difficult time with bowel prep due to quadriplegia, and dysautonomia symptoms and if colonoscopy is needed he will require hospital admission for bowel prep. May also need anesthesia consultation given dysautonomia. Further plans pending results of labs and CT  Cc; Dr Zada Girt    Jaleah Lefevre Genia Harold PA-C 01/10/2015

## 2015-01-10 NOTE — Patient Instructions (Addendum)
Please go to the basement level to have your labs drawn.    You have been scheduled for a CT scan of the abdomen and pelvis at [facility] in the [facility]. Entrance A.  [place] are scheduled on Wednesday 01-12-2015 at 2:00 PM  . You should arrive at 1:45 PM  prior to your appointment time for registration. Please follow the written instructions below on the day of your exam:  WARNING: IF YOU ARE ALLERGIC TO IODINE/X-RAY DYE, PLEASE NOTIFY RADIOLOGY IMMEDIATELY AT [phone]! YOU WILL BE GIVEN A 13 HOUR PREMEDICATION PREP.  1) Do not eat or drink anything after 10:00 am   (4 hours prior to your test) 2) You have been given 2 bottles of oral contrast to drink. The solution may taste better if refrigerated, but do NOT add ice or any other liquid to this solution. Shake well before drinking.    Drink 1 bottle of contrast '@12' :00 Noon Am  (2 hours prior to your exam)  Drink 1 bottle of contrast @ 1:00 Pm  (1 hour prior to your exam)  You may take any medications as prescribed with a small amount of water except for the following: Metformin, Glucophage, Glucovance, Avandamet, Riomet, Fortamet, Actoplus Met, Janumet, Glumetza or Metaglip. The above medications must be held the day of the exam AND 48 hours after the exam.  The purpose of you drinking the oral contrast is to aid in the visualization of your intestinal tract. The contrast solution may cause some diarrhea. Before your exam is started, you will be given a small amount of fluid to drink. Depending on your individual set of symptoms, you may also receive an intravenous injection of x-ray contrast/dye. Plan on being at [facility] for 30 minutes or long, depending on the type of exam you are having performed.  If you have any questions regarding your exam or if you need to reschedule, you may call the CT department at [phone] between the hours of 8:00 am and 5:00 pm,  Monday-Friday.  ________________________________________________________________________

## 2015-01-11 ENCOUNTER — Other Ambulatory Visit: Payer: Self-pay

## 2015-01-11 DIAGNOSIS — D696 Thrombocytopenia, unspecified: Secondary | ICD-10-CM

## 2015-01-11 LAB — COMPREHENSIVE METABOLIC PANEL
ALK PHOS: 109 U/L (ref 39–117)
ALT: 35 U/L (ref 0–53)
AST: 29 U/L (ref 0–37)
Albumin: 3.9 g/dL (ref 3.5–5.2)
BUN: 15 mg/dL (ref 6–23)
CALCIUM: 9.3 mg/dL (ref 8.4–10.5)
CHLORIDE: 106 meq/L (ref 96–112)
CO2: 26 meq/L (ref 19–32)
CREATININE: 0.49 mg/dL (ref 0.40–1.50)
GFR: 189.91 mL/min (ref >60.00)
Glucose, Bld: 86 mg/dL (ref 70–99)
POTASSIUM: 3.9 meq/L (ref 3.5–5.1)
Sodium: 138 mEq/L (ref 135–145)
Total Bilirubin: 0.4 mg/dL (ref 0.2–1.2)
Total Protein: 7 g/dL (ref 6.0–8.3)

## 2015-01-11 NOTE — Progress Notes (Signed)
i agree with the above note, plan 

## 2015-01-12 ENCOUNTER — Encounter (HOSPITAL_COMMUNITY): Payer: Self-pay

## 2015-01-12 ENCOUNTER — Ambulatory Visit (HOSPITAL_COMMUNITY)
Admission: RE | Admit: 2015-01-12 | Discharge: 2015-01-12 | Disposition: A | Discharge status: Home / Self Care | Payer: Medicare Other | Source: Ambulatory Visit | Attending: Physician Assistant | Admitting: Physician Assistant

## 2015-01-12 DIAGNOSIS — R194 Change in bowel habit: Secondary | ICD-10-CM | POA: Diagnosis not present

## 2015-01-12 DIAGNOSIS — R1032 Left lower quadrant pain: Secondary | ICD-10-CM | POA: Insufficient documentation

## 2015-01-12 MED ORDER — IOHEXOL 300 MG/ML  SOLN
100.0000 mL | Freq: Once | INTRAMUSCULAR | Status: AC | PRN
  Administered 2015-01-12: 100 mL via INTRAVENOUS

## 2015-01-19 ENCOUNTER — Ambulatory Visit (HOSPITAL_COMMUNITY): Payer: Medicare Other

## 2015-01-20 ENCOUNTER — Other Ambulatory Visit: Payer: Self-pay

## 2015-01-20 DIAGNOSIS — Z1211 Encounter for screening for malignant neoplasm of colon: Secondary | ICD-10-CM

## 2015-02-10 LAB — COLOGUARD: Cologuard: POSITIVE

## 2015-02-14 ENCOUNTER — Other Ambulatory Visit (INDEPENDENT_AMBULATORY_CARE_PROVIDER_SITE_OTHER): Payer: Medicare Other

## 2015-02-14 DIAGNOSIS — D696 Thrombocytopenia, unspecified: Secondary | ICD-10-CM

## 2015-02-14 LAB — CBC WITH DIFFERENTIAL/PLATELET
BASOS ABS: 0 10*3/uL (ref 0.0–0.1)
BASOS PCT: 0.6 % (ref 0.0–3.0)
EOS ABS: 0.2 10*3/uL (ref 0.0–0.7)
Eosinophils Relative: 5.8 % — ABNORMAL HIGH (ref 0.0–5.0)
HCT: 39.8 % (ref 39.0–52.0)
Hemoglobin: 13.9 g/dL (ref 13.0–17.0)
LYMPHS PCT: 22.8 % (ref 12.0–46.0)
Lymphs Abs: 0.9 10*3/uL (ref 0.7–4.0)
MCHC: 34.8 g/dL (ref 30.0–36.0)
MCV: 95 fl (ref 78.0–100.0)
Monocytes Absolute: 0.4 10*3/uL (ref 0.1–1.0)
Monocytes Relative: 10.4 % (ref 3.0–12.0)
NEUTROS PCT: 60.4 % (ref 43.0–77.0)
Neutro Abs: 2.4 10*3/uL (ref 1.4–7.7)
Platelets: 148 10*3/uL — ABNORMAL LOW (ref 150.0–400.0)
RBC: 4.19 Mil/uL — ABNORMAL LOW (ref 4.22–5.81)
RDW: 13 % (ref 11.5–15.5)
WBC: 4 10*3/uL (ref 4.0–10.5)

## 2015-02-21 ENCOUNTER — Telehealth: Payer: Self-pay | Admitting: Gastroenterology

## 2015-02-21 ENCOUNTER — Other Ambulatory Visit: Payer: Self-pay | Admitting: Gastroenterology

## 2015-02-21 LAB — COLOGUARD: COLOGUARD: POSITIVE

## 2015-02-21 NOTE — Telephone Encounter (Signed)
Patty, cologuard stool testing was positive and so I think we need to proceed with colonoscopy.  He will need hosp admission the day prior for bowel prep.  Lets try to do this during my next hosp week (early May).  Thanks for helping coordinate.  Amy, Juluis Rainier

## 2015-02-22 ENCOUNTER — Telehealth: Payer: Self-pay | Admitting: Physician Assistant

## 2015-02-23 NOTE — Telephone Encounter (Signed)
Gary Burch, cologuard stool testing was positive and so I think we need to proceed with colonoscopy. He will need hosp admission the day prior for bowel prep. Lets try to do this during my next hosp week (early May). Thanks for helping coordinate.  Amy, Juluis Rainier

## 2015-02-25 NOTE — Telephone Encounter (Signed)
Left message on machine to call back  

## 2015-03-01 NOTE — Telephone Encounter (Signed)
Patty-this will need to be coordinated with Dr Ardis Hughs schedule and not on a Monday as he will require admit the day before for prep

## 2015-03-01 NOTE — Telephone Encounter (Signed)
[phone] pt calling about Cologuard results. You called for him.  Thanks Amy

## 2015-03-01 NOTE — Telephone Encounter (Signed)
Spoke with the patient and explained the Cologuard results and the plan of care.Patient agrees to an admission to the hospital for prep and colonoscopy. He would prefer to do this on a Monday , Tuesday or Wednesday.

## 2015-03-02 NOTE — Telephone Encounter (Signed)
Lets try to shoot for my next hosp week (the first week in May) on either m, tu, or wed.  Thanks

## 2015-03-02 NOTE — Telephone Encounter (Signed)
See DR. Ardis Hughs suggestion

## 2015-03-07 NOTE — Telephone Encounter (Signed)
Pt said he is calling back about his cologuard results

## 2015-03-10 NOTE — Telephone Encounter (Signed)
Pt is aware that he will be called on Tuesday 03/22/15 for pre admit for colon prep.  [facility] admit was called and are aware and will call the pt on Tue morning when a bed is available the pt is aware.  I have sent a staff message to Cecille Rubin with a heads up that the pt will be arriving.  Sharee Pimple was notified at [facility] that the pt will be having a colon on 03/23/15

## 2015-03-15 ENCOUNTER — Encounter: Payer: Self-pay | Admitting: Gastroenterology

## 2015-03-16 ENCOUNTER — Telehealth: Payer: Self-pay | Admitting: Gastroenterology

## 2015-03-16 ENCOUNTER — Encounter: Payer: Medicare Other | Attending: Physical Medicine & Rehabilitation | Admitting: Physical Medicine & Rehabilitation

## 2015-03-16 ENCOUNTER — Encounter: Payer: Self-pay | Admitting: Physical Medicine & Rehabilitation

## 2015-03-16 VITALS — BP 80/50 | HR 85 | Resp 14

## 2015-03-16 DIAGNOSIS — S5401XS Injury of ulnar nerve at forearm level, right arm, sequela: Secondary | ICD-10-CM | POA: Diagnosis not present

## 2015-03-16 DIAGNOSIS — M75101 Unspecified rotator cuff tear or rupture of right shoulder, not specified as traumatic: Secondary | ICD-10-CM

## 2015-03-16 DIAGNOSIS — K592 Neurogenic bowel, not elsewhere classified: Secondary | ICD-10-CM | POA: Diagnosis not present

## 2015-03-16 DIAGNOSIS — N319 Neuromuscular dysfunction of bladder, unspecified: Secondary | ICD-10-CM | POA: Diagnosis not present

## 2015-03-16 DIAGNOSIS — M7711 Lateral epicondylitis, right elbow: Secondary | ICD-10-CM | POA: Diagnosis not present

## 2015-03-16 DIAGNOSIS — S14106S Unspecified injury at C6 level of cervical spinal cord, sequela: Secondary | ICD-10-CM | POA: Diagnosis present

## 2015-03-16 NOTE — Progress Notes (Signed)
Subjective:    Patient ID: Gary Burch, male    DOB: [birth year], 54 y.o.   MRN: [id number]  HPI   Mr. Bickham is here in follow up of his SCI and annual follow up with me. He has had some thrombocytopenia this year. His most recent platelet count was down to 115k.   He has a colonscopy scheduled next week. He has some "trepidation" regarding the bowel prep and potential dysreflexia associated with the event. They are apparently admitting him for the bowel prep prior to the procedure. He had a + colon screen test apparently.   From a bladder standpoint, he has remained fairly stable. He finished the ampicillin 6 weeks ago. He quite doxycycline 2-3 days ago. His stools have been somewhat softer and loose as a result.   Skin has been intact.   Pain levels have been stable.       Pain Inventory Average Pain 2 Pain Right Now 2 My pain is stabbing and tingling  In the last 24 hours, has pain interfered with the following? General activity 3 Relation with others 2 Enjoyment of life 2 What TIME of day is your pain at its worst? all Sleep (in general) Fair  Pain is worse with: sitting and inactivity Pain improves with: rest, heat/ice and medication Relief from Meds: 4  Mobility ability to climb steps?  no do you drive?  no use a wheelchair needs help with transfers Do you have any goals in this area?  no  Function disabled: date disabled 58 I need assistance with the following:  dressing, bathing, toileting, meal prep, household duties and shopping Do you have any goals in this area?  no  Neuro/Psych tingling spasms  Prior Studies Any changes since last visit?  no  Physicians involved in your care Any changes since last visit?  no   Family History  Problem Relation Age of Onset  . Colon polyps Father    History   Social History  . Marital Status: Single    Spouse Name: N/A  . Number of Children: N/A  . Years of Education: N/A   Social History Main  Topics  . Smoking status: Never Smoker   . Smokeless tobacco: Never Used  . Alcohol Use: 0.0 oz/week    0 Standard drinks or equivalent per week     Comment: occasional  . Drug Use: No  . Sexual Activity: Not on file   Other Topics Concern  . None   Social History Narrative   Past Surgical History  Procedure Laterality Date  . Eye surgery      bilateral cataract  . Spine surgery  1981    fusion   Past Medical History  Diagnosis Date  . Spine injury   . Cellulitis 01-2013    left leg  . Kidney stone   . UTI (lower urinary tract infection)   . Cataracts, bilateral   . Retinal tear     bilateral   BP 80/50 mmHg  Pulse 85  Resp 14  SpO2 97%  Opioid Risk Score:   Fall Risk Score:  `1  Depression screen PHQ 2/9  No flowsheet data found.    Review of Systems  Gastrointestinal: Positive for abdominal pain and diarrhea.  Neurological:       Tingling Spasms   All other systems reviewed and are negative.      Objective:   Physical Exam  Constitutional: He is oriented to person, place, and time. He appears  well-developed and well-nourished.  HENT:  Head: Normocephalic and atraumatic.  Eyes: Conjunctivae and EOM are normal.  Cardiovascular: Normal rate.  Pulmonary/Chest: Effort normal and breath sounds normal.  Abdominal: Soft.  Musculoskeletal: Neurological: He is alert and oriented to person, place, and time.  Exam is unchanged with weakness below the C6 level. He is sensory complete. Reflexes are 3+. Sustained clonus noted in both ankles is still present Psychiatric: He has a normal mood and affect. His behavior is normal. Judgment and thought content normal.    Assessment & Plan:   1. C6 spinal cord injury, complete.  2. Right lateral epicondylitis with ulnar nerve irritation  3. Right rotator cuff syndrome  4. Neurogenic bowel and bladder  5. Thrombocytopenia: likely due to lyrica (delayed response)   Plan:  1. Tramadol for breakthrough pain  (rarely uses).  2.Recommend re-checking platelets in 2-3 weeks. If there is a further drop, then I would decrease the lyrica further to 100mg  bid and recheck in 2-3 weeks.  3. Probiotics, fiber for stool consistency.   4. Bowel prep, mgt per hospital team. May have a dysreflexic episode which needs to be aggressively managed while in the hospital.   5. Continue local skin care  I'll see him back in about a year. 15 minutes of face to face patient care time were spent during this visit. All questions were encouraged and answered.

## 2015-03-16 NOTE — Patient Instructions (Signed)
RE-CHECK PLATELETS IN ABOUT 2-3 WEEKS. IF PLATELETS HAVE DECREASED FURTHER, I WOULD RECOMMEND DECREASING YOUR LYRICA TO 100MG  2 X DAILY WITH A RECHECK OF PLATELETS IN ANOTHER 2-3 WEEKS AFTER THAT.

## 2015-03-17 NOTE — Telephone Encounter (Signed)
The pt is worried about having the prep done and how he will feel.  I tried to explain that is why he is being admitted so that he will have help with prepping and any symptoms he may have.  The pt agreed to go ahead as planned and will call with further questions

## 2015-03-21 ENCOUNTER — Telehealth: Payer: Self-pay | Admitting: Gastroenterology

## 2015-03-21 NOTE — Telephone Encounter (Signed)
Spoke with patient and told him that the hospital will call him when a bed is ready. He wants to know if it will be in AM. Explained that it depends on when they have a bed ready/discharges. He understands.

## 2015-03-22 ENCOUNTER — Observation Stay (HOSPITAL_COMMUNITY)
Admission: RE | Admit: 2015-03-22 | Discharge: 2015-03-23 | Disposition: A | Discharge status: Home / Self Care | Payer: Medicare Other | Source: Ambulatory Visit | Attending: Gastroenterology | Admitting: Gastroenterology

## 2015-03-22 ENCOUNTER — Encounter (HOSPITAL_COMMUNITY): Payer: Self-pay

## 2015-03-22 DIAGNOSIS — R1032 Left lower quadrant pain: Secondary | ICD-10-CM | POA: Diagnosis present

## 2015-03-22 DIAGNOSIS — Z9842 Cataract extraction status, left eye: Secondary | ICD-10-CM | POA: Diagnosis not present

## 2015-03-22 DIAGNOSIS — Z8371 Family history of colonic polyps: Secondary | ICD-10-CM | POA: Diagnosis not present

## 2015-03-22 DIAGNOSIS — Z87440 Personal history of urinary (tract) infections: Secondary | ICD-10-CM | POA: Diagnosis not present

## 2015-03-22 DIAGNOSIS — Z881 Allergy status to other antibiotic agents status: Secondary | ICD-10-CM | POA: Insufficient documentation

## 2015-03-22 DIAGNOSIS — Z882 Allergy status to sulfonamides status: Secondary | ICD-10-CM | POA: Insufficient documentation

## 2015-03-22 DIAGNOSIS — Z981 Arthrodesis status: Secondary | ICD-10-CM | POA: Diagnosis not present

## 2015-03-22 DIAGNOSIS — Z9841 Cataract extraction status, right eye: Secondary | ICD-10-CM | POA: Diagnosis not present

## 2015-03-22 DIAGNOSIS — Z87442 Personal history of urinary calculi: Secondary | ICD-10-CM | POA: Insufficient documentation

## 2015-03-22 DIAGNOSIS — G8250 Quadriplegia, unspecified: Secondary | ICD-10-CM | POA: Diagnosis present

## 2015-03-22 DIAGNOSIS — R195 Other fecal abnormalities: Secondary | ICD-10-CM

## 2015-03-22 LAB — COMPREHENSIVE METABOLIC PANEL
ALT: 30 U/L (ref 17–63)
AST: 25 U/L (ref 15–41)
Albumin: 3.9 g/dL (ref 3.5–5.0)
Alkaline Phosphatase: 105 U/L (ref 38–126)
Anion gap: 11 (ref 5–15)
BUN: 11 mg/dL (ref 6–20)
CO2: 26 mmol/L (ref 22–32)
CREATININE: 0.49 mg/dL — AB (ref 0.61–1.24)
Calcium: 9.1 mg/dL (ref 8.9–10.3)
Chloride: 103 mmol/L (ref 101–111)
GFR calc Af Amer: >60 mL/min (ref >60)
GFR calc non Af Amer: >60 mL/min (ref >60)
Glucose, Bld: 81 mg/dL (ref 70–99)
Potassium: 3.9 mmol/L (ref 3.5–5.1)
Sodium: 140 mmol/L (ref 135–145)
TOTAL PROTEIN: 7.1 g/dL (ref 6.5–8.1)
Total Bilirubin: 0.6 mg/dL (ref 0.3–1.2)

## 2015-03-22 LAB — CBC
HCT: 41 % (ref 39.0–52.0)
Hemoglobin: 13.5 g/dL (ref 13.0–17.0)
MCH: 31.8 pg (ref 26.0–34.0)
MCHC: 32.9 g/dL (ref 30.0–36.0)
MCV: 96.7 fL (ref 78.0–100.0)
PLATELETS: 204 10*3/uL (ref 150–400)
RBC: 4.24 MIL/uL (ref 4.22–5.81)
RDW: 12.9 % (ref 11.5–15.5)
WBC: 5.6 10*3/uL (ref 4.0–10.5)

## 2015-03-22 LAB — PROTIME-INR
INR: 1.03 (ref 0.00–1.49)
Prothrombin Time: 13.6 seconds (ref 11.6–15.2)

## 2015-03-22 MED ORDER — ACETAMINOPHEN 650 MG RE SUPP: 650.0000 mg | Freq: Four times a day (QID) | RECTAL | Status: DC | PRN

## 2015-03-22 MED ORDER — PREGABALIN 50 MG PO CAPS
100.0000 mg | ORAL_CAPSULE | Freq: Two times a day (BID) | ORAL | Status: DC
  Administered 2015-03-22 – 2015-03-23 (×3): 100 mg via ORAL
  Filled 2015-03-22 (×3): qty 2

## 2015-03-22 MED ORDER — SENNA 8.6 MG PO TABS: 1.0000 | ORAL_TABLET | Freq: Two times a day (BID) | ORAL | Status: DC

## 2015-03-22 MED ORDER — PEG-KCL-NACL-NASULF-NA ASC-C 100 G PO SOLR
0.5000 | Freq: Once | ORAL | Status: AC
  Administered 2015-03-23: 100 g via ORAL
  Filled 2015-03-22: qty 1

## 2015-03-22 MED ORDER — SODIUM CHLORIDE 0.9 % IJ SOLN: 3.0000 mL | Freq: Two times a day (BID) | INTRAMUSCULAR | Status: DC

## 2015-03-22 MED ORDER — BACLOFEN 10 MG PO TABS
10.0000 mg | ORAL_TABLET | Freq: Four times a day (QID) | ORAL | Status: DC
  Administered 2015-03-22 – 2015-03-23 (×4): 10 mg via ORAL
  Filled 2015-03-22 (×7): qty 1

## 2015-03-22 MED ORDER — HYDROCODONE-ACETAMINOPHEN 5-325 MG PO TABS: 1.0000 | ORAL_TABLET | ORAL | Status: DC | PRN

## 2015-03-22 MED ORDER — PEG-KCL-NACL-NASULF-NA ASC-C 100 G PO SOLR
0.5000 | Freq: Once | ORAL | Status: AC
  Administered 2015-03-22: 111.9 g via ORAL
  Filled 2015-03-22: qty 1

## 2015-03-22 MED ORDER — SODIUM CHLORIDE 0.9 % IV SOLN
250.0000 mL | INTRAVENOUS | Status: DC
  Administered 2015-03-22: 250 mL via INTRAVENOUS

## 2015-03-22 MED ORDER — DOXAZOSIN MESYLATE 4 MG PO TABS
6.0000 mg | ORAL_TABLET | Freq: Every day | ORAL | Status: DC
  Administered 2015-03-22: 6 mg via ORAL
  Filled 2015-03-22 (×2): qty 1

## 2015-03-22 MED ORDER — HYDROMORPHONE HCL 1 MG/ML IJ SOLN: 1.0000 mg | INTRAMUSCULAR | Status: DC | PRN

## 2015-03-22 MED ORDER — SODIUM CHLORIDE 0.9 % IJ SOLN: 3.0000 mL | INTRAMUSCULAR | Status: DC | PRN

## 2015-03-22 MED ORDER — ONDANSETRON HCL 4 MG PO TABS: 4.0000 mg | ORAL_TABLET | Freq: Four times a day (QID) | ORAL | Status: DC | PRN

## 2015-03-22 MED ORDER — ONDANSETRON HCL 4 MG/2ML IJ SOLN: 4.0000 mg | Freq: Four times a day (QID) | INTRAMUSCULAR | Status: DC | PRN

## 2015-03-22 MED ORDER — PEG-KCL-NACL-NASULF-NA ASC-C 100 G PO SOLR: 1.0000 | Freq: Once | ORAL | Status: DC

## 2015-03-22 MED ORDER — ACETAMINOPHEN 325 MG PO TABS: 650.0000 mg | ORAL_TABLET | Freq: Four times a day (QID) | ORAL | Status: DC | PRN

## 2015-03-22 MED ORDER — BISACODYL 10 MG RE SUPP: 10.0000 mg | Freq: Every day | RECTAL | Status: DC | PRN

## 2015-03-22 MED ORDER — DIAZEPAM 2 MG PO TABS: 2.0000 mg | ORAL_TABLET | Freq: Three times a day (TID) | ORAL | Status: DC | PRN

## 2015-03-22 MED ORDER — BETHANECHOL CHLORIDE 25 MG PO TABS
50.0000 mg | ORAL_TABLET | Freq: Three times a day (TID) | ORAL | Status: DC
  Administered 2015-03-22 – 2015-03-23 (×3): 50 mg via ORAL
  Filled 2015-03-22 (×5): qty 2

## 2015-03-22 NOTE — H&P (Signed)
[facility] Admission Note   Primary Care Physician:  Criselda Peaches, MD Primary Gastroenterologist:  Dr. Ardis Hughs  CHIEF COMPLAINT: Positive cologard test, quadriplegia.  HPI: Gary Burch is a 54 y.o. male who was evaluated in the office by Nicoletta Ba, PA-C, for left lower quadrant pain. [place] is quadriplegic with removal C6 spinal cord injury. He has a neurogenic bowel and bladder and what he describes as dysautonomia.  When patient was seen in the office in February he was complaining of a 4 month history of persistent left abdominal pain. He described his discomfort as sharp and exacerbated with sitting up. He had not noticed any change in oral intake nor had he noticed any change with bowel movements. His appetite was fine and his weight was stable. He had seen a small amount of streaky bright red blood with bowel movements on several occasions. His family history was positive for colon polyps in his family. Patient was sent for an abdominal pelvic CT that he had on February 24 that revealed no acute findings in the abdomen or pelvis and no explanation for his left lower quadrant pain. He was noted to have mild thickening of the distal appendix which measured up to 9 mm. No periappendiceal fat stranding or free fluid. Compared with study from 2008 this was improved. He also had bladder wall thickening containing numerous diverticuli compatible with neurogenic bladder and a suspected rectocele. Patient then had cold guard stool testing which was positive and so he was advised to proceed with colonoscopy. Due to his quadriplegia he is admitted for bowel prep.Patient says he has very difficult time whenever he has loose bowel movements or incontinence or diarrhea. He says these episodes are usually accompanied by chills diaphoresis and clamminess and weakness and he feels miserable.   Past Medical History  Diagnosis Date  . Spine  injury   . Cellulitis 01-2013    left leg  . Kidney stone   . UTI (lower urinary tract infection)   . Cataracts, bilateral   . Retinal tear     bilateral    Past Surgical History  Procedure Laterality Date  . Eye surgery      bilateral cataract  . Spine surgery  1981    fusion    Prior to Admission medications   Medication Sig Start Date End Date Taking? Authorizing Provider  ampicillin (PRINCIPEN) 500 MG capsule  02/18/14   Historical Provider, MD  baclofen (LIORESAL) 10 MG tablet Take 10 mg by mouth 4 (four) times daily.    Levin Erp, MD  bethanechol (URECHOLINE) 25 MG tablet Take 50 mg by mouth 3 (three) times daily.    Historical Provider, MD  Cetirizine-Pseudoephedrine (ZYRTEC-D PO) Take 1 tablet by mouth daily as needed.    Historical Provider, MD  clindamycin-benzoyl peroxide (BENZACLIN) gel Apply topically 2 (two) times daily.    Historical Provider, MD  Dapsone 5 % topical gel Apply topically 2 (two) times daily.    Historical Provider, MD  diazepam (VALIUM) 2 MG tablet Take 2 mg by mouth every 8 (eight) hours as needed.     Levin Erp, MD  diclofenac sodium (VOLTAREN) 1 % GEL Apply 1 application topically 3 (three) times daily. 02/06/12   Meredith Staggers, MD  doxazosin (CARDURA) 4 MG tablet TAKE 1 1/2 TABLETS BY MOUTH AT BEDTIME 09/24/14   Meredith Staggers, MD  pregabalin (LYRICA) 150 MG capsule Take 150 mg by mouth 3 (three) times daily.  Levin Erp, MD  tazarotene (TAZORAC) 0.1 % gel Apply topically at bedtime.    Historical Provider, MD  traMADol (ULTRAM) 50 MG tablet Take 0.5-1 tablets (25-50 mg total) by mouth every 6 (six) hours as needed for pain. 02/09/13   Meredith Staggers, MD    Current Facility-Administered Medications  Medication Dose Route Frequency Provider Last Rate Last Dose  . 0.9 %  sodium chloride infusion  250 mL Intravenous Continuous Lori P Hvozdovic, PA-C      . acetaminophen (TYLENOL) tablet 650 mg  650 mg Oral Q6H PRN Lori P Hvozdovic, PA-C        Or  . acetaminophen (TYLENOL) suppository 650 mg  650 mg Rectal Q6H PRN Lori P Hvozdovic, PA-C      . baclofen (LIORESAL) tablet 10 mg  10 mg Oral QID Lori P Hvozdovic, PA-C      . bethanechol (URECHOLINE) tablet 50 mg  50 mg Oral TID Lori P Hvozdovic, PA-C      . bisacodyl (DULCOLAX) suppository 10 mg  10 mg Rectal Daily PRN Lori P Hvozdovic, PA-C      . diazepam (VALIUM) tablet 2 mg  2 mg Oral Q8H PRN Lori P Hvozdovic, PA-C      . doxazosin (CARDURA) tablet 6 mg  6 mg Oral Daily Lori P Hvozdovic, PA-C      . HYDROcodone-acetaminophen (NORCO/VICODIN) 5-325 MG per tablet 1-2 tablet  1-2 tablet Oral Q4H PRN Lori P Hvozdovic, PA-C      . HYDROmorphone (DILAUDID) injection 1 mg  1 mg Intravenous Q4H PRN Lori P Hvozdovic, PA-C      . ondansetron (ZOFRAN) tablet 4 mg  4 mg Oral Q6H PRN Lori P Hvozdovic, PA-C       Or  . ondansetron (ZOFRAN) injection 4 mg  4 mg Intravenous Q6H PRN Lori P Hvozdovic, PA-C      . peg 3350 powder (MOVIPREP) kit 200 g  1 kit Oral Once Lori P Hvozdovic, PA-C      . pregabalin (LYRICA) capsule 100 mg  100 mg Oral BID Lori P Hvozdovic, PA-C      . senna (SENOKOT) tablet 8.6 mg  1 tablet Oral BID Lori P Hvozdovic, PA-C      . sodium chloride 0.9 % injection 3 mL  3 mL Intravenous Q12H Lori P Hvozdovic, PA-C      . sodium chloride 0.9 % injection 3 mL  3 mL Intravenous PRN Lori P Hvozdovic, PA-C        Allergies as of 03/10/2015 - Review Complete 01/12/2015  Allergen Reaction Noted  . Macrodantin  01/30/2012  . Sulfa antibiotics  01/30/2012    Family History  Problem Relation Age of Onset  . Colon polyps Father     History   Social History  . Marital Status: Single    Spouse Name: N/A  . Number of Children: N/A  . Years of Education: N/A   Occupational History  . Not on file.   Social History Main Topics  . Smoking status: Never Smoker   . Smokeless tobacco: Never Used  . Alcohol Use: 0.0 oz/week    0 Standard drinks or equivalent per week      Comment: occasional  . Drug Use: No  . Sexual Activity: No   Other Topics Concern  . Not on file   Social History Narrative    REVIEW OF SYSTEMS: Per history of present illness, otherwise negative.   PHYSICAL EXAM:  Temp:  [97.7  F (36.5 C)] 97.7 F (36.5 C) (05/03 1232) Pulse Rate:  [83] 83 (05/03 1232) Resp:  [18] 18 (05/03 1232) BP: (93)/(64) 93/64 mmHg (05/03 1232) SpO2:  [100 %] 100 % (05/03 1232)    General: Alert, oriented white male in no wheelchair Ears:  No external abnormalities Mouth:  No oral ulcers Neck: Supple, no JVD Lungs:  Clear bilaterally Heart:  Regular rate and rhythm, S1-S2 no murmur Abdomen:  Soft, nontender, positive bowel sound   Rectal:  Deferred Extremities:  Quadriplegia, muscular atrophy of upper extremities, fingertips bluish  Psych: Mood and affect appropriate Skin:  No obvious patient   LAB RESULTS: CBC on 02/14/2015 white blood cell 4, hemoglobin 13.9, hematocrit 39.8, platelets 148,000.  RADIOLOGY STUDIES:  Abdomen Pelvis W Contrast   Status: Final result       PACS Images     Show images for CT Abdomen Pelvis W Contrast     Study Result     CLINICAL DATA: Left lower quadrant pain. Fall  EXAM: CT ABDOMEN AND PELVIS WITH CONTRAST  TECHNIQUE: Multidetector CT imaging of the abdomen and pelvis was performed using the standard protocol following bolus administration of intravenous contrast.  CONTRAST: 122m OMNIPAQUE IOHEXOL 300 MG/ML SOLN  COMPARISON: 05/02/2007.  FINDINGS: Lower chest: No pleural effusion identified.  Hepatobiliary: There is no suspicious liver abnormality. The gallbladder appears normal.  Pancreas: Normal appearance of the pancreas.  Spleen: Negative.  Adrenals/Urinary Tract: The adrenal glands are both normal. Tiny nonobstructing right renal calculus is node measuring 2-3 mm. No right hydronephrosis or hydroureter. Normal appearance of the left kidney. The urinary  bladder appears abnormal. The wall is diffusely trabeculated with numerous diverticula. The appearance is similar to previous exam. No focal filling defects identified within the bladder lumen.  Stomach/Bowel: Normal appearance of the stomach. The small bowel loops have a normal caliber without evidence for bowel obstruction. Normal appearance the proximal appendix appears normal. There is mild thickening and mucosal enhancement involving the distal aspect of the appendix which has a maximum diameter of 9 mm. On the previous study from 2008 the appendix had a maximum diameter of 1.2 cm. No periappendiceal fat stranding or free fluid identified. The colon appears within normal limits. Evidence of rectocele noted.  Vascular/Lymphatic: Normal appearance stress that mild calcified atherosclerotic change involves the distal abdominal aorta. No aneurysm. No enlarged retroperitoneal or mesenteric adenopathy. No enlarged pelvic or inguinal lymph nodes.  Reproductive: Postoperative defect within the central portion of the prostate gland suggests prior TURP.  Other: No free fluid or fluid collections identified.  Musculoskeletal: Review of the visualized osseous structures is unremarkable.  IMPRESSION: 1. No acute findings within the abdomen or pelvis. No explanation for left lower quadrant pain. 2. Mild thickening of the distal appendix which measures up to 9 mm. No periappendiceal fat stranding or free fluid. Compared with study from 2008 this is improved from 12 mm. 3. Bladder wall thickening containing numerous diverticula compatible with neurogenic bladder. 4. Suspect rectocele.   Electronically Signed  By: TKerby MoorsM.D.  On: 01/12/2015 16:56     IMPRESSION/PLAN:  54year old male was quadriplegia, status post remote C6 spinal cord injury with a several month history of left lower quadrant pain. CT of the abdomen and pelvis with no acute findings in the abdomen  and pelvis. Stool: Guard testing positive. Patient has a family history of colon polyps in his father. Patient is admitted to be prepped as an inpatient for colonoscopy tomorrow morning.  Hvozdovic, Vita Barley  03/22/2015, 1:39 PM Pager [phone]  ________________________________________________________________________  Velora Heckler GI MD note:  I personally examined the patient, reviewed the data and agree with the assessment and plan described above.  Planning for prep tonight and colonoscopy tomorrow.   Owens Loffler, MD [facility] Pager [phone]

## 2015-03-23 ENCOUNTER — Encounter (HOSPITAL_COMMUNITY): Payer: Self-pay | Admitting: Gastroenterology

## 2015-03-23 ENCOUNTER — Encounter (HOSPITAL_COMMUNITY)
Admission: RE | Disposition: A | Discharge status: Home / Self Care | Payer: Medicare Other | Source: Ambulatory Visit | Attending: Gastroenterology

## 2015-03-23 DIAGNOSIS — R195 Other fecal abnormalities: Secondary | ICD-10-CM | POA: Diagnosis not present

## 2015-03-23 DIAGNOSIS — R1032 Left lower quadrant pain: Secondary | ICD-10-CM | POA: Diagnosis not present

## 2015-03-23 DIAGNOSIS — Z8371 Family history of colonic polyps: Secondary | ICD-10-CM | POA: Diagnosis not present

## 2015-03-23 HISTORY — PX: COLONOSCOPY WITH PROPOFOL: SHX5780

## 2015-03-23 SURGERY — COLONOSCOPY WITH PROPOFOL
Anesthesia: Moderate Sedation

## 2015-03-23 MED ORDER — FENTANYL CITRATE (PF) 100 MCG/2ML IJ SOLN
INTRAMUSCULAR | Status: DC | PRN
  Administered 2015-03-23 (×3): 25 ug via INTRAVENOUS

## 2015-03-23 MED ORDER — SODIUM CHLORIDE 0.9 % IV SOLN: INTRAVENOUS | Status: DC

## 2015-03-23 MED ORDER — FENTANYL CITRATE (PF) 100 MCG/2ML IJ SOLN
INTRAMUSCULAR | Status: AC
  Filled 2015-03-23: qty 2

## 2015-03-23 MED ORDER — MIDAZOLAM HCL 10 MG/2ML IJ SOLN
INTRAMUSCULAR | Status: DC | PRN
  Administered 2015-03-23 (×3): 2 mg via INTRAVENOUS

## 2015-03-23 MED ORDER — MIDAZOLAM HCL 10 MG/2ML IJ SOLN
INTRAMUSCULAR | Status: AC
  Filled 2015-03-23: qty 2

## 2015-03-23 SURGICAL SUPPLY — 22 items

## 2015-03-23 NOTE — Progress Notes (Signed)
     [facility] Progress Note  Subjective:   For colonoscopy today. Tol prep--didn't feel sick with diarrhea as he has at home in the past. Nurse reports dark brown watery stool this morning.   Objective:  Vital signs in last 24 hours: Temp:  [97.7 F (36.5 C)-98.4 F (36.9 C)] 98.4 F (36.9 C) (05/04 0512) Pulse Rate:  [74-83] 74 (05/04 0512) Resp:  [18-20] 18 (05/04 0512) BP: (78-97)/(50-64) 97/56 mmHg (05/04 0512) SpO2:  [97 %-100 %] 97 % (05/04 0512) Last BM Date: 03/22/15 General:   Alert,  Well-developed,    in NAD Heart:  Regular rate and rhythm; no murmurs Pulm;lungs clear Abdomen:  Soft, nontender and nondistended. Normal bowel sounds, without guarding, and without rebound.   Extremities:  Without edema. Psych: ALlert and cooperative. Normal mood and affect.  Intake/Output from previous day: 05/03 0701 - 05/04 0700 In: -  Out: 800 [Urine:800] Intake/Output this shift: Total I/O In: -  Out: 500 [Urine:500]  Lab Results:  Recent Labs  03/22/15 1435  WBC 5.6  HGB 13.5  HCT 41.0  PLT 204   BMET  Recent Labs  03/22/15 1435  NA 140  K 3.9  CL 103  CO2 26  GLUCOSE 81  BUN 11  CREATININE 0.49*  CALCIUM 9.1   LFT  Recent Labs  03/22/15 1435  PROT 7.1  ALBUMIN 3.9  AST 25  ALT 30  ALKPHOS 105  BILITOT 0.6   PT/INR  Recent Labs  03/22/15 1435  LABPROT 13.6  INR 1.03     ASSESSMENT/PLAN:    54 year old male was quadriplegia, status post remote C6 spinal cord injury with a several month history of left lower quadrant pain. CT of the abdomen and pelvis with no acute findings in the abdomen and pelvis. Stool: cologuard testing positive. Patient has a family history of colon polyps in his father. Pt for colonoscopy this morning. Will order tap water eneam as stools still dark.      Hanley Woerner, Deloris Ping 03/23/2015, Pager [phone]

## 2015-03-23 NOTE — Interval H&P Note (Signed)
History and Physical Interval Note:  03/23/2015 10:33 AM  Gary Burch  has presented today for surgery, with the diagnosis of positive cologard  The various methods of treatment have been discussed with the patient and family. After consideration of risks, benefits and other options for treatment, the patient has consented to  Procedure(s): COLONOSCOPY WITH PROPOFOL (N/A) as a surgical intervention .  The patient's history has been reviewed, patient examined, no change in status, stable for surgery.  I have reviewed the patient's chart and labs.  Questions were answered to the patient's satisfaction.     Milus Banister

## 2015-03-23 NOTE — Progress Notes (Signed)
Tap water enema done as ordered

## 2015-03-23 NOTE — Plan of Care (Signed)
Problem: Phase I Progression Outcomes Goal: Voiding-avoid urinary catheter unless indicated Outcome: Not Applicable Date Met:  65/68/12 Self-caths d/t quadriplegia

## 2015-03-23 NOTE — Discharge Summary (Signed)
[facility] Discharge Summary  Name: Gary Burch MRN: [id number] DOB: July 05, 1961 54 y.o. PCP:  Criselda Peaches, MD  Date of Admission: 03/22/2015 12:01 PM Date of Discharge: 03/23/2015 Primary Gastroenterologist:Dr. Ardis Hughs Discharging Physician:Dr. Ardis Hughs  Discharge Diagnosis: Active Problems:   Quadriplegia normal colonoscopy  Consultations:none  Procedures Performed:  Colonoscopy  GI Procedures: Colonoscopy 03/23/15 ENDOSCOPIC IMPRESSION: Normal colonoscopy RECOMMENDATIONS: The cologuard was a False Positive. He is ok to discharge home today. eSigned: Milus Banister, MD 03/23/2015 11:49 AM  History/Physical Exam:  See Admission H&P  Admission HPI: Gary Burch is a 54 y.o. male who was evaluated in the office by Nicoletta Ba, PA-C, for left lower quadrant pain. Gary Burch is quadriplegic with removal C6 spinal cord injury. He has a neurogenic bowel and bladder and what he describes as dysautonomia.  When patient was seen in the office in February he was complaining of a 4 month history of persistent left abdominal pain. He described his discomfort as sharp and exacerbated with sitting up. He had not noticed any change in oral intake nor had he noticed any change with bowel movements. His appetite was fine and his weight was stable. He had seen a small amount of streaky bright red blood with bowel movements on several occasions. His family history was positive for colon polyps in his family. Patient was sent for an abdominal pelvic CT that he had on February 24 that revealed no acute findings in the abdomen or pelvis and no explanation for his left lower quadrant pain. He was noted to have mild thickening of the distal appendix which measured up to 9 mm. No periappendiceal fat stranding or free fluid. Compared with study from 2008 this was improved. He also had bladder wall thickening containing numerous diverticuli compatible with neurogenic bladder and a suspected  rectocele. Patient then had cologuard stool testing which was positive and so he was advised to proceed with colonoscopy. Due to his quadriplegia he is admitted for bowel prep.MoviPrep bowel preparation and tolerated it well.  Hospital Course by problem list: Active Problems:   Quadriplegia  colorectal cancer screening.  Patient was admitted on May 3 for bowel prep. Patient is quadriplegic and would be unable to prep on his own at home. Patient was administered MoviPrep as an inpatient and tolerated the prep well. Patient underwent a colonoscopy on May 4 which was a normal exam. Patient was discharged home.  Discharge Vitals:  BP 90/37 mmHg  Pulse 74  Temp(Src) 97.9 F (36.6 C) (Oral)  Resp 16  Ht 5\' 11"  (1.803 m)  SpO2 99%  Physical Exam  General: Alert, oriented white male in no wheelchair Ears: No external abnormalities Mouth: No oral ulcers Neck: Supple, no JVD Lungs: Clear bilaterally Heart: Regular rate and rhythm, S1-S2 no murmur Abdomen: Soft, nontender, positive bowel sound  Rectal: Deferred Extremities: Quadriplegia, muscular atrophy of upper extremities, fingertips bluish  Psych: Mood and affect appropriate Skin: No obvious lesions         :Discharge Labs:  Results for orders placed or performed during the hospital encounter of 03/22/15 (from the past 24 hour(s))  Comprehensive metabolic panel     Status: Abnormal   Collection Time: 03/22/15  2:35 PM  Result Value Ref Range   Sodium 140 135 - 145 mmol/L   Potassium 3.9 3.5 - 5.1 mmol/L   Chloride 103 101 - 111 mmol/L   CO2 26 22 - 32 mmol/L   Glucose, Bld 81 70 - 99 mg/dL  BUN 11 6 - 20 mg/dL   Creatinine, Ser 0.49 (L) 0.61 - 1.24 mg/dL   Calcium 9.1 8.9 - 10.3 mg/dL   Total Protein 7.1 6.5 - 8.1 g/dL   Albumin 3.9 3.5 - 5.0 g/dL   AST 25 15 - 41 U/L   ALT 30 17 - 63 U/L   Alkaline Phosphatase 105 38 - 126 U/L   Total Bilirubin 0.6 0.3 - 1.2 mg/dL   GFR calc non Af Amer >60 >60 mL/min    GFR calc Af Amer >60 >60 mL/min   Anion gap 11 5 - 15  CBC     Status: None   Collection Time: 03/22/15  2:35 PM  Result Value Ref Range   WBC 5.6 4.0 - 10.5 K/uL   RBC 4.24 4.22 - 5.81 MIL/uL   Hemoglobin 13.5 13.0 - 17.0 g/dL   HCT 41.0 39.0 - 52.0 %   MCV 96.7 78.0 - 100.0 fL   MCH 31.8 26.0 - 34.0 pg   MCHC 32.9 30.0 - 36.0 g/dL   RDW 12.9 11.5 - 15.5 %   Platelets 204 150 - 400 K/uL  Protime-INR     Status: None   Collection Time: 03/22/15  2:35 PM  Result Value Ref Range   Prothrombin Time 13.6 11.6 - 15.2 seconds   INR 1.03 0.00 - 1.49    Disposition and follow-up:   Gary Burch was discharged from [facility] in stable condition.    Follow-up Appointments: Discharge Instructions    Call MD for:  redness, tenderness, or signs of infection (pain, swelling, bleeding, redness, odor or green/yellow discharge around incision site)    Complete by:  As directed      Call MD for:  severe or increased pain, loss or decreased feeling  in affected limb(s)    Complete by:  As directed      Call MD for:  temperature >100.5    Complete by:  As directed      Discharge instructions    Complete by:  As directed      Resume previous diet    Complete by:  As directed            Discharge Medications:   Medication List    TAKE these medications        baclofen 10 MG tablet  Commonly known as:  LIORESAL  Take 10 mg by mouth 4 (four) times daily.     clindamycin-benzoyl peroxide gel  Commonly known as:  BENZACLIN  Apply 1 application topically daily as needed (for acne).     Cranberry 450 MG Tabs  Take 1 tablet by mouth daily.     Dapsone 5 % topical gel  Apply 1 application topically 2 (two) times daily.     diazepam 2 MG tablet  Commonly known as:  VALIUM  Take 2 mg by mouth 2 (two) times daily.     diclofenac sodium 1 % Gel  Commonly known as:  VOLTAREN  Apply 1 application topically 3 (three) times daily.     doxazosin 4 MG tablet  Commonly  known as:  CARDURA  TAKE 1 1/2 TABLETS BY MOUTH AT BEDTIME     multivitamin with minerals Tabs tablet  Take 1 tablet by mouth daily.     pregabalin 150 MG capsule  Commonly known as:  LYRICA  Take 150 mg by mouth 2 (two) times daily.     PROBIOTIC DAILY Caps  Take 1 capsule  by mouth daily.     tazarotene 0.1 % gel  Commonly known as:  TAZORAC  Apply 1 application topically at bedtime.     traMADol 50 MG tablet  Commonly known as:  ULTRAM  Take 0.5-1 tablets (25-50 mg total) by mouth every 6 (six) hours as needed for pain.     URECHOLINE 25 MG tablet  Generic drug:  bethanechol  Take 50 mg by mouth 3 (three) times daily.     ZYRTEC-D PO  Take 1 tablet by mouth at bedtime.        Signed: Claretha Townshend, Vita Barley PA-C 03/23/2015, 12:09 PM

## 2015-03-23 NOTE — Progress Notes (Signed)
UR completed 

## 2015-03-23 NOTE — Op Note (Signed)
[facility] [place], [address]   COLONOSCOPY PROCEDURE REPORT  PATIENT: Gary Burch, Gary Burch  MR#: [id number] BIRTHDATE: 05/18/1961 , 54  yrs. old GENDER: male ENDOSCOPIST: Milus Banister, MD PROCEDURE DATE:  03/23/2015 PROCEDURE:   Colonoscopy, diagnostic First Screening Colonoscopy - Avg.  risk and is 50 yrs.  old or older - No.  Prior Negative Screening - Now for repeat screening. N/A  History of Adenoma - Now for follow-up colonoscopy & has been > or = to 3 yrs.  N/A  Polyps Removed Today ASA CLASS:   Class III INDICATIONS:+ cologuard testing, LLQ pain. MEDICATIONS: Fentanyl 75 mcg IV and Versed 6 mg IV  DESCRIPTION OF PROCEDURE:   After the risks benefits and alternatives of the procedure were thoroughly explained, informed consent was obtained.  The digital rectal exam revealed no abnormalities of the rectum.   The Pentax Adult Colonscope [id number] endoscope was introduced through the anus and advanced to the cecum, which was identified by both the appendix and ileocecal valve. No adverse events experienced.   The quality of the prep was good.  The instrument was then slowly withdrawn as the colon was fully examined.  Images were taken but not saved due to technical issue with EndoPro system     COLON FINDINGS: A normal appearing cecum, ileocecal valve, and appendiceal orifice were identified.  The ascending, transverse, descending, sigmoid colon, and rectum appeared unremarkable. Retroflexed views revealed no abnormalities. The time to cecum = Withdrawal time =      The scope was withdrawn and the procedure completed. COMPLICATIONS: There were no immediate complications.  ENDOSCOPIC IMPRESSION: Normal colonoscopy  RECOMMENDATIONS: The cologuard was a False Positive.  He is ok to discharge home today.  eSigned:  Milus Banister, MD 03/23/2015 11:49 AM

## 2015-03-24 ENCOUNTER — Encounter (HOSPITAL_COMMUNITY): Payer: Self-pay | Admitting: Gastroenterology

## 2015-05-06 ENCOUNTER — Encounter: Payer: Self-pay | Admitting: Podiatry

## 2015-05-06 ENCOUNTER — Ambulatory Visit: Payer: Medicare Other | Admitting: Podiatry

## 2015-05-06 ENCOUNTER — Ambulatory Visit (INDEPENDENT_AMBULATORY_CARE_PROVIDER_SITE_OTHER): Payer: Medicare Other | Admitting: Podiatry

## 2015-05-06 VITALS — BP 85/51 | HR 65 | Resp 18

## 2015-05-06 DIAGNOSIS — W450XXA Nail entering through skin, initial encounter: Secondary | ICD-10-CM | POA: Diagnosis not present

## 2015-05-06 DIAGNOSIS — M79672 Pain in left foot: Secondary | ICD-10-CM
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
## 2015-06-02 ENCOUNTER — Telehealth: Payer: Self-pay | Admitting: Gastroenterology

## 2015-06-02 NOTE — Telephone Encounter (Signed)
Left message on machine to call back  

## 2015-06-07 NOTE — Telephone Encounter (Signed)
Pt was on abx (pt not sure what) per Dr Nyoka Cowden and began having LL abd pain.  He was told to stop the abx and has started noticing some improvement.  He will call back if he does not continue to improve.

## 2015-06-08 NOTE — Telephone Encounter (Signed)
Ok, thanks.

## 2015-06-09 ENCOUNTER — Telehealth: Payer: Self-pay | Admitting: Gastroenterology

## 2015-06-10 NOTE — Telephone Encounter (Signed)
Dr Ardis Hughs can we write this letter?

## 2015-06-11 NOTE — Telephone Encounter (Signed)
Please send Gary copy of his Gary Burch office visit note from recent visit stating why we decided for in patient colonoscopy.  Also Gary note from me stating the same.  Happy to help him.

## 2015-06-14 NOTE — Telephone Encounter (Signed)
Letter completed and pt aware it will be mailed today.

## 2015-07-19 ENCOUNTER — Other Ambulatory Visit: Payer: Self-pay | Admitting: Physical Medicine & Rehabilitation

## 2015-09-20 HISTORY — PX: RETINAL DETACHMENT SURGERY: SHX105

## 2015-10-19 ENCOUNTER — Encounter: Payer: Self-pay | Admitting: Podiatry

## 2015-10-19 ENCOUNTER — Ambulatory Visit (INDEPENDENT_AMBULATORY_CARE_PROVIDER_SITE_OTHER): Payer: Medicare Other | Admitting: Podiatry

## 2015-10-19 VITALS — BP 99/63 | HR 63 | Resp 65

## 2015-10-19 DIAGNOSIS — B351 Tinea unguium: Secondary | ICD-10-CM | POA: Diagnosis not present

## 2015-10-19 NOTE — Progress Notes (Signed)
   Subjective:    Patient ID: ABDURRAHMAN SOUTHARD, male    DOB: October 05, 1961, 54 y.o.   MRN: [id number]  HPI    Review of Systems  All other systems reviewed and are negative.      Objective:   Physical Exam        Assessment & Plan:

## 2015-11-07 ENCOUNTER — Other Ambulatory Visit: Payer: Self-pay | Admitting: Ophthalmology

## 2015-11-08 ENCOUNTER — Other Ambulatory Visit: Payer: Self-pay | Admitting: Ophthalmology

## 2015-11-09 ENCOUNTER — Encounter (HOSPITAL_COMMUNITY): Payer: Self-pay | Admitting: *Deleted

## 2015-11-09 NOTE — Progress Notes (Signed)
Pt denies SOB, chest pain, and being under the care of a cardiologist. Pt denies having a stress test , echo and cardiac cath. Pt denies having an EKG and chest x ray within the last year. Pt made aware to stop taking Aspirin, otc vitamins, fish oil, Zyrtec-D, Cranberry, and herbal medications. Do not take any NSAIDs ie: Ibuprofen, Advil, Naproxen or any medication containing Aspirin such as Voltaren. Pt verbalized understanding of all pre-op instructions.

## 2015-11-10 ENCOUNTER — Encounter (HOSPITAL_COMMUNITY): Payer: Self-pay

## 2015-11-10 ENCOUNTER — Ambulatory Visit (HOSPITAL_COMMUNITY): Payer: Medicare Other | Admitting: Certified Registered Nurse Anesthetist

## 2015-11-10 ENCOUNTER — Ambulatory Visit (HOSPITAL_COMMUNITY)
Admission: RE | Admit: 2015-11-10 | Discharge: 2015-11-10 | Disposition: A | Discharge status: Home / Self Care | Payer: Medicare Other | Source: Ambulatory Visit | Attending: Ophthalmology | Admitting: Ophthalmology

## 2015-11-10 ENCOUNTER — Encounter (HOSPITAL_COMMUNITY)
Admission: RE | Disposition: A | Discharge status: Home / Self Care | Payer: Self-pay | Source: Ambulatory Visit | Attending: Ophthalmology

## 2015-11-10 DIAGNOSIS — H33011 Retinal detachment with single break, right eye: Secondary | ICD-10-CM | POA: Insufficient documentation

## 2015-11-10 DIAGNOSIS — Z993 Dependence on wheelchair: Secondary | ICD-10-CM | POA: Insufficient documentation

## 2015-11-10 DIAGNOSIS — Z961 Presence of intraocular lens: Secondary | ICD-10-CM | POA: Diagnosis not present

## 2015-11-10 HISTORY — DX: Malignant (primary) neoplasm, unspecified: C80.1

## 2015-11-10 HISTORY — PX: PARS PLANA VITRECTOMY: SHX2166

## 2015-11-10 HISTORY — DX: Abnormal levels of other serum enzymes: R74.8

## 2015-11-10 HISTORY — PX: GAS/FLUID EXCHANGE: SHX5334

## 2015-11-10 HISTORY — DX: Pneumonia, unspecified organism: J18.9

## 2015-11-10 HISTORY — DX: Cardiac murmur, unspecified: R01.1

## 2015-11-10 LAB — CBC
HEMATOCRIT: 41.6 % (ref 39.0–52.0)
HEMOGLOBIN: 13.6 g/dL (ref 13.0–17.0)
MCH: 31.9 pg (ref 26.0–34.0)
MCHC: 32.7 g/dL (ref 30.0–36.0)
MCV: 97.7 fL (ref 78.0–100.0)
Platelets: 146 10*3/uL — ABNORMAL LOW (ref 150–400)
RBC: 4.26 MIL/uL (ref 4.22–5.81)
RDW: 12.9 % (ref 11.5–15.5)
WBC: 6.3 10*3/uL (ref 4.0–10.5)

## 2015-11-10 LAB — BASIC METABOLIC PANEL
ANION GAP: 9 (ref 5–15)
BUN: 11 mg/dL (ref 6–20)
CALCIUM: 9.1 mg/dL (ref 8.9–10.3)
CO2: 25 mmol/L (ref 22–32)
Chloride: 106 mmol/L (ref 101–111)
Creatinine, Ser: 0.62 mg/dL (ref 0.61–1.24)
GLUCOSE: 83 mg/dL (ref 65–99)
POTASSIUM: 4 mmol/L (ref 3.5–5.1)
SODIUM: 140 mmol/L (ref 135–145)

## 2015-11-10 SURGERY — PARS PLANA VITRECTOMY WITH 25 GAUGE
Anesthesia: Monitor Anesthesia Care | Site: Eye | Laterality: Right

## 2015-11-10 MED ORDER — CYCLOPENTOLATE HCL 1 % OP SOLN
1.0000 [drp] | OPHTHALMIC | Status: AC | PRN
  Administered 2015-11-10 (×3): 1 [drp] via OPHTHALMIC

## 2015-11-10 MED ORDER — BSS PLUS IO SOLN
INTRAOCULAR | Status: DC | PRN
  Administered 2015-11-10: 1 via INTRAOCULAR

## 2015-11-10 MED ORDER — BSS PLUS IO SOLN
INTRAOCULAR | Status: AC
  Filled 2015-11-10: qty 500

## 2015-11-10 MED ORDER — PHENYLEPHRINE HCL 2.5 % OP SOLN
OPHTHALMIC | Status: AC
  Administered 2015-11-10: 1 [drp] via OPHTHALMIC
  Filled 2015-11-10: qty 2

## 2015-11-10 MED ORDER — PHENYLEPHRINE HCL 2.5 % OP SOLN
1.0000 [drp] | OPHTHALMIC | Status: AC | PRN
  Administered 2015-11-10 (×3): 1 [drp] via OPHTHALMIC

## 2015-11-10 MED ORDER — DEXAMETHASONE SODIUM PHOSPHATE 10 MG/ML IJ SOLN
INTRAMUSCULAR | Status: AC
  Filled 2015-11-10: qty 1

## 2015-11-10 MED ORDER — FENTANYL CITRATE (PF) 250 MCG/5ML IJ SOLN
INTRAMUSCULAR | Status: AC
  Filled 2015-11-10: qty 5

## 2015-11-10 MED ORDER — CYCLOPENTOLATE HCL 1 % OP SOLN
OPHTHALMIC | Status: AC
  Administered 2015-11-10: 1 [drp] via OPHTHALMIC
  Filled 2015-11-10: qty 2

## 2015-11-10 MED ORDER — PROPOFOL 10 MG/ML IV BOLUS
INTRAVENOUS | Status: AC
  Filled 2015-11-10: qty 20

## 2015-11-10 MED ORDER — HYPROMELLOSE (GONIOSCOPIC) 2.5 % OP SOLN
OPHTHALMIC | Status: AC
  Filled 2015-11-10: qty 15

## 2015-11-10 MED ORDER — DEXAMETHASONE SODIUM PHOSPHATE 10 MG/ML IJ SOLN
INTRAMUSCULAR | Status: DC | PRN
  Administered 2015-11-10: 2 mL

## 2015-11-10 MED ORDER — PROPOFOL 10 MG/ML IV BOLUS
INTRAVENOUS | Status: DC | PRN
  Administered 2015-11-10 (×2): 10 mg via INTRAVENOUS
  Administered 2015-11-10 (×2): 20 mg via INTRAVENOUS

## 2015-11-10 MED ORDER — GATIFLOXACIN 0.5 % OP SOLN
OPHTHALMIC | Status: AC
  Administered 2015-11-10: 1 [drp] via OPHTHALMIC
  Filled 2015-11-10: qty 2.5

## 2015-11-10 MED ORDER — LIDOCAINE HCL 2 % IJ SOLN
INTRAMUSCULAR | Status: AC
  Filled 2015-11-10: qty 20

## 2015-11-10 MED ORDER — LACTATED RINGERS IV SOLN
INTRAVENOUS | Status: DC
  Administered 2015-11-10 (×2): via INTRAVENOUS

## 2015-11-10 MED ORDER — LIDOCAINE HCL 2 % IJ SOLN
INTRAMUSCULAR | Status: DC | PRN
  Administered 2015-11-10: 10 mL

## 2015-11-10 MED ORDER — LIDOCAINE HCL (CARDIAC) 20 MG/ML IV SOLN
INTRAVENOUS | Status: AC
  Filled 2015-11-10: qty 5

## 2015-11-10 MED ORDER — LIDOCAINE HCL (CARDIAC) 20 MG/ML IV SOLN
INTRAVENOUS | Status: DC | PRN
  Administered 2015-11-10 (×2): 50 mg via INTRATRACHEAL

## 2015-11-10 MED ORDER — GATIFLOXACIN 0.5 % OP SOLN
1.0000 [drp] | OPHTHALMIC | Status: AC | PRN
  Administered 2015-11-10 (×3): 1 [drp] via OPHTHALMIC

## 2015-11-10 MED ORDER — MIDAZOLAM HCL 2 MG/2ML IJ SOLN
INTRAMUSCULAR | Status: AC
  Filled 2015-11-10: qty 2

## 2015-11-10 SURGICAL SUPPLY — 63 items
APL SRG 3 HI ABS STRL LF PLS (MISCELLANEOUS)
APPLICATOR COTTON TIP 6IN STRL (MISCELLANEOUS) ×4 IMPLANT
APPLICATOR DR MATTHEWS STRL (MISCELLANEOUS) IMPLANT
BLADE 10 SAFETY STRL DISP (BLADE) ×1 IMPLANT
BLADE MVR KNIFE 20G (BLADE) ×3 IMPLANT
CANNULA ANT CHAM MAIN (OPHTHALMIC RELATED) IMPLANT
CANNULA VLV SOFT TIP 25G (OPHTHALMIC) ×1 IMPLANT
CANNULA VLV SOFT TIP 25GA (OPHTHALMIC) ×4 IMPLANT
CORDS BIPOLAR (ELECTRODE) IMPLANT
COVER MAYO STAND STRL (DRAPES) ×3 IMPLANT
DRAPE INCISE 51X51 W/FILM STRL (DRAPES) ×3 IMPLANT
DRAPE OPHTHALMIC 77X100 STRL (CUSTOM PROCEDURE TRAY) ×4 IMPLANT
FILTER BLUE MILLIPORE (MISCELLANEOUS) IMPLANT
FORCEPS ECKARDT ILM 25G SERR (OPHTHALMIC RELATED) IMPLANT
FORCEPS GRIESHABER ILM 25G A (INSTRUMENTS) ×3 IMPLANT
FORCEPS HORIZONTAL 25G DISP (OPHTHALMIC RELATED) IMPLANT
FORCEPS ILM 25G DSP TIP (MISCELLANEOUS) IMPLANT
GAS AUTO FILL CONSTEL (OPHTHALMIC) ×4
GAS AUTO FILL CONSTELLATION (OPHTHALMIC) ×1 IMPLANT
GAUZE SPONGE 2X2 8PLY NS (GAUZE/BANDAGES/DRESSINGS) ×3 IMPLANT
GLOVE SS BIOGEL STRL SZ 8.5 (GLOVE) ×2 IMPLANT
GLOVE SUPERSENSE BIOGEL SZ 8.5 (GLOVE) ×2
GOWN STRL REUS W/ TWL LRG LVL3 (GOWN DISPOSABLE) ×2 IMPLANT
GOWN STRL REUS W/ TWL XL LVL3 (GOWN DISPOSABLE) ×2 IMPLANT
GOWN STRL REUS W/TWL LRG LVL3 (GOWN DISPOSABLE) ×4
GOWN STRL REUS W/TWL XL LVL3 (GOWN DISPOSABLE) ×4
KIT BASIN OR (CUSTOM PROCEDURE TRAY) ×4 IMPLANT
KNIFE CRESCENT 2.5 55 ANG (BLADE) IMPLANT
LENS BIOM SUPER VIEW SET DISP (OPHTHALMIC RELATED) ×4 IMPLANT
MICROPICK 25G (MISCELLANEOUS)
NDL 18GX1X1/2 (RX/OR ONLY) (NEEDLE) IMPLANT
NDL 25GX 5/8IN NON SAFETY (NEEDLE) IMPLANT
NDL FILTER BLUNT 18X1 1/2 (NEEDLE) IMPLANT
NDL HYPO 25GX1X1/2 BEV (NEEDLE) IMPLANT
NEEDLE 18GX1X1/2 (RX/OR ONLY) (NEEDLE) IMPLANT
NEEDLE 25GX 5/8IN NON SAFETY (NEEDLE) IMPLANT
NEEDLE FILTER BLUNT 18X 1/2SAF (NEEDLE)
NEEDLE FILTER BLUNT 18X1 1/2 (NEEDLE) IMPLANT
NEEDLE HYPO 25GX1X1/2 BEV (NEEDLE) IMPLANT
NS IRRIG 1000ML POUR BTL (IV SOLUTION) ×4 IMPLANT
PACK FRAGMATOME (OPHTHALMIC) IMPLANT
PACK VITRECTOMY CUSTOM (CUSTOM PROCEDURE TRAY) ×4 IMPLANT
PAD ARMBOARD 7.5X6 YLW CONV (MISCELLANEOUS) ×5 IMPLANT
PAK PIK VITRECTOMY CVS 25GA (OPHTHALMIC) ×4 IMPLANT
PENCIL BIPOLAR 25GA STR DISP (OPHTHALMIC RELATED) IMPLANT
PICK MICROPICK 25G (MISCELLANEOUS) IMPLANT
PROBE LASER ILLUM FLEX CVD 25G (OPHTHALMIC) IMPLANT
ROLLS DENTAL (MISCELLANEOUS) IMPLANT
SCRAPER DIAMOND 25GA (OPHTHALMIC RELATED) IMPLANT
SET INJECTOR OIL FLUID CONSTEL (OPHTHALMIC) IMPLANT
STOCKINETTE IMPERVIOUS 9X36 MD (GAUZE/BANDAGES/DRESSINGS) ×8 IMPLANT
STOPCOCK 4 WAY LG BORE MALE ST (IV SETS) IMPLANT
SUT ETHILON 10 0 CS140 6 (SUTURE) IMPLANT
SUT ETHILON 8 0 BV130 4 (SUTURE) IMPLANT
SUT MERSILENE 5 0 RD 1 DA (SUTURE) IMPLANT
SUT PROLENE 10 0 CIF 4 DA (SUTURE) IMPLANT
SUT VICRYL 7 0 TG140 8 (SUTURE) ×3 IMPLANT
SYR 30ML SLIP (SYRINGE) IMPLANT
SYR 5ML LL (SYRINGE) IMPLANT
SYR TB 1ML LUER SLIP (SYRINGE) IMPLANT
SYRINGE 10CC LL (SYRINGE) ×1 IMPLANT
WATER STERILE IRR 1000ML POUR (IV SOLUTION) ×4 IMPLANT
WIPE INSTRUMENT VISIWIPE 73X73 (MISCELLANEOUS) ×3 IMPLANT

## 2015-11-10 NOTE — Anesthesia Postprocedure Evaluation (Signed)
Anesthesia Post Note  Patient: Gary Burch  Procedure(s) Performed: Procedure(s) (LRB): PARS PLANA VITRECTOMY WITH 25 GAUGE with endolaser  (Right) GAS/FLUID EXCHANGE- C3F8 10% used (Right)  Patient location during evaluation: PACU Anesthesia Type: MAC Level of consciousness: awake and alert Pain management: pain level controlled Vital Signs Assessment: post-procedure vital signs reviewed and stable Respiratory status: spontaneous breathing, nonlabored ventilation, respiratory function stable and patient connected to nasal cannula oxygen Cardiovascular status: stable and blood pressure returned to baseline Anesthetic complications: no    Last Vitals:  Filed Vitals:   11/10/15 1550 11/10/15 1600  BP: 93/62   Pulse: 84 77  Temp: 36.5 C   Resp: 16 14    Last Pain: There were no vitals filed for this visit.               [place]

## 2015-11-10 NOTE — H&P (Signed)
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  Patient is unknown spine injury and is wheelchair-bound. The patient has pseudophakia and has under gone prevcataract  extraction with intraocular lens placement under local anesthesia without difficulty in the past.  Visual acuity right eye is 20/50 with macular threatened rhegmatogenous detachment of the right eye inferiorly. He has a past history of superonasal retinal adjuvant in the right eye. Now has an inferior retina retinal detachment rhegmatogenous.  impression : pseudo- phakic rhegmatogenous detachment of the right eye macula on,    plan: 1 repair rhegmatogenous detachment of the right eye via vitrectomy, endolaser, gas injection, possible silicone oil injection right eye under local anesthesia with monitored anesthesia control. Patient the possibility of using general anesthesia. He reports having some occasional spasticity and a slit was able to be elicited in the preop holding area with me saying that this last only for a few seconds and apparently only involves his abdominal region in his lower legs up. His his head and neck did not appear to shake. He reports having no difficulties with positioning in the supine position for short periods of time. His findings and these discussions I will pursue with Dr. Sharol Roussel potential use of local anesthesia with monitored anesthesia control and potential conversion to general anesthesia if the patient becomes quite anxious aureus spasticity develops which causes movement of the head and neck area.

## 2015-11-10 NOTE — Op Note (Signed)
Preoperative diagnosis: 1 rhegmatogenous retinal detachment inferior and inferotemporal right eye, macula on, pseudophakic #2 superotemporal old retinal detachment with laser demarcation  Postoperative diagnosis: 1-2 the same  Procedure: Repair rhegmatogenous retinal detachments, right eye, via vitrectomy, air-fluid exchange, endolaser retinopexy, internal drainage subretinal fluid, membrane peel, since C3F8 10% vitreous substitute right eye  Surgeon Hurman Horn M.D.  Anesthesia local retrobulbar monitored anesthesia control  Indication for procedure: Patient has profound visual loss and superior visual field loss progression of retinal detachment in the in the right eye separate and later to previous superotemporal retinal detachment. Macula is threatened although back is not attached. Patient says [place] in attempt to surgically reattach retina. He understands that there is issues with his spinal disorder position potential positioning that may require the use of general anesthesia although we intend to use local anesthesia via retrobulbar with monitored anesthesia control initially. Patient sent the risk of anesthesia including the rare occurrence death but also to the eye from underlying condition and it surgical para including but not limited to hemorrhage, infection, scarring, need for another surgery, no change of vision, loss of vision and progression of disease despite intervention.  Appropriate signed as it was obtained patient was taken to the operating room. In the operating room appropriate monitoring was followed by mild sedation. Timeout was carried out with staff and the surgeon thereafter no mild sedation 2% Xylocaine 5 mL injected retrobulbar with additional 5 mL laterally fashion modified Kirk Ruths. The] region sterilely prepped and draped in usual ophthalmic fashion. Surgical timeout area out again a second time and time and thereafter lid speculum applied. 25-gauge trocar placed in the  inferotemporal quadrant placement vision verified visually infusion turned on. Superior trochars applied. Cortectomy was begun. Posterior hyaloid spontaneous detached. Vitreous base circumcised through 43. Anterior hyaloid confirmed be removed. Retinal break noted at the 1:30 position with its localized attachment was its anterior to a detachments were amputated and trim. Inferiorly extended from the 4:00 to the 6:00 position at an anterior to the equator. Its break was located at the 4:00 position and abutted an old retinal break with endolaser retinopexy. No break eye was identified its anterior traction changes was amputated. With a extrusion needle I created a small retinotomy the posterior dependent portion of the detachment inferiorly of this allowed for fluid fluid drainage. After fluid air exchange completed. Regionally preretinal subretinal fluid was aspirated without 15 minutes. Retina reattached nicely. Of these. Localized attachment was also drains were fashion. Was photo regulation placed in bed attachment around the retinal breaks and finally with aspiration subretinal fluid around the retinotomy sites. Cases occurred. I judge that this would be an adequate situation to use C3F8 10% to provide of appropriate [place] not as the retina reattached nicely without any ongoing tractional forces.  An air-C3F8 10% exchange completed. [place] trochars in infusion within removed and the wounds were found to be secure. The pressure assessment be adequate. Subconjunctival ejection Decadron carried out inferiorly. Sterile patch and Fox shot shield applied. Patient taken from anesthesia without difficulty PACU heme stained dynamically stable will be discharged home as an out patient.

## 2015-11-10 NOTE — Brief Op Note (Signed)
Diagnosis rhegmatogenous retinal detachment pseudophakic right eye inferior and superotemporal  REPAIR OF RETINAL retinal detachment right eye via Oser vitrectomy, membrane peel, panretinal endolaser photocoagulation, internal drainage of subretinal fluid, injection of C3F8 10% gas  Anesthesia local retrobulbar with monitored anesthesia control  Surgeon Dominica Severin a Zakee Deerman M.D.

## 2015-11-10 NOTE — Anesthesia Preprocedure Evaluation (Signed)
Anesthesia Evaluation  Patient identified by MRN, date of birth, ID band Patient awake    Reviewed: Allergy & Precautions, NPO status , Patient's Chart, lab work & pertinent test results  Airway Mallampati: I  TM Distance: >3 FB Neck ROM: Full    Dental   Pulmonary    Pulmonary exam normal        Cardiovascular Normal cardiovascular exam     Neuro/Psych C6 Quad    GI/Hepatic   Endo/Other    Renal/GU      Musculoskeletal   Abdominal   Peds  Hematology   Anesthesia Other Findings   Reproductive/Obstetrics                             Anesthesia Physical Anesthesia Plan  ASA: III  Anesthesia Plan: MAC   Post-op Pain Management:    Induction: Intravenous  Airway Management Planned: Simple Face Mask  Additional Equipment:   Intra-op Plan:   Post-operative Plan:   Informed Consent: I have reviewed the patients History and Physical, chart, labs and discussed the procedure including the risks, benefits and alternatives for the proposed anesthesia with the patient or authorized representative who has indicated his/her understanding and acceptance.     Plan Discussed with: CRNA and Surgeon  Anesthesia Plan Comments:         Anesthesia Quick Evaluation

## 2015-11-10 NOTE — Transfer of Care (Signed)
Immediate Anesthesia Transfer of Care Note  Patient: Gary Burch  Procedure(s) Performed: Procedure(s): PARS PLANA VITRECTOMY WITH 25 GAUGE with endolaser  (Right) INJECTION OF SILICONE OIL with membrane peel (Right)  Patient Location: PACU  Anesthesia Type:MAC  Level of Consciousness: awake, alert , oriented, patient cooperative and responds to stimulation  Airway & Oxygen Therapy: Patient Spontanous Breathing  Post-op Assessment: Report given to RN and Post -op Vital signs reviewed and stable  Post vital signs: Reviewed and stable  Last Vitals:  Filed Vitals:   11/10/15 1224  BP: 97/53  Pulse: 61  Temp: 36.7 C  Resp: 18    Complications: No apparent anesthesia complications

## 2015-11-11 ENCOUNTER — Encounter (HOSPITAL_COMMUNITY): Payer: Self-pay | Admitting: Ophthalmology

## 2015-12-24 ENCOUNTER — Emergency Department (HOSPITAL_COMMUNITY)
Admission: EM | Admit: 2015-12-24 | Discharge: 2015-12-24 | Disposition: A | Discharge status: Home / Self Care | Payer: Medicare Other | Attending: Emergency Medicine | Admitting: Emergency Medicine

## 2015-12-24 ENCOUNTER — Emergency Department (HOSPITAL_COMMUNITY): Payer: Medicare Other

## 2015-12-24 ENCOUNTER — Encounter (HOSPITAL_COMMUNITY): Payer: Self-pay

## 2015-12-24 DIAGNOSIS — Z8701 Personal history of pneumonia (recurrent): Secondary | ICD-10-CM | POA: Diagnosis not present

## 2015-12-24 DIAGNOSIS — Z872 Personal history of diseases of the skin and subcutaneous tissue: Secondary | ICD-10-CM | POA: Diagnosis not present

## 2015-12-24 DIAGNOSIS — Z8669 Personal history of other diseases of the nervous system and sense organs: Secondary | ICD-10-CM | POA: Diagnosis not present

## 2015-12-24 DIAGNOSIS — Z7952 Long term (current) use of systemic steroids: Secondary | ICD-10-CM | POA: Insufficient documentation

## 2015-12-24 DIAGNOSIS — Z87442 Personal history of urinary calculi: Secondary | ICD-10-CM | POA: Diagnosis not present

## 2015-12-24 DIAGNOSIS — Z791 Long term (current) use of non-steroidal anti-inflammatories (NSAID): Secondary | ICD-10-CM | POA: Insufficient documentation

## 2015-12-24 DIAGNOSIS — Z792 Long term (current) use of antibiotics: Secondary | ICD-10-CM | POA: Diagnosis not present

## 2015-12-24 DIAGNOSIS — R42 Dizziness and giddiness: Secondary | ICD-10-CM | POA: Diagnosis not present

## 2015-12-24 DIAGNOSIS — Z9842 Cataract extraction status, left eye: Secondary | ICD-10-CM | POA: Insufficient documentation

## 2015-12-24 DIAGNOSIS — Z85828 Personal history of other malignant neoplasm of skin: Secondary | ICD-10-CM | POA: Insufficient documentation

## 2015-12-24 DIAGNOSIS — Z87828 Personal history of other (healed) physical injury and trauma: Secondary | ICD-10-CM | POA: Insufficient documentation

## 2015-12-24 DIAGNOSIS — Z79899 Other long term (current) drug therapy: Secondary | ICD-10-CM | POA: Insufficient documentation

## 2015-12-24 DIAGNOSIS — R011 Cardiac murmur, unspecified: Secondary | ICD-10-CM | POA: Insufficient documentation

## 2015-12-24 DIAGNOSIS — Z9841 Cataract extraction status, right eye: Secondary | ICD-10-CM | POA: Insufficient documentation

## 2015-12-24 DIAGNOSIS — R079 Chest pain, unspecified: Secondary | ICD-10-CM | POA: Diagnosis present

## 2015-12-24 DIAGNOSIS — R0789 Other chest pain: Secondary | ICD-10-CM | POA: Diagnosis not present

## 2015-12-24 LAB — CBC
HCT: 40.7 % (ref 39.0–52.0)
Hemoglobin: 13.9 g/dL (ref 13.0–17.0)
MCH: 33.3 pg (ref 26.0–34.0)
MCHC: 34.2 g/dL (ref 30.0–36.0)
MCV: 97.4 fL (ref 78.0–100.0)
PLATELETS: 176 10*3/uL (ref 150–400)
RBC: 4.18 MIL/uL — AB (ref 4.22–5.81)
RDW: 13 % (ref 11.5–15.5)
WBC: 4.1 10*3/uL (ref 4.0–10.5)

## 2015-12-24 LAB — I-STAT TROPONIN, ED: Troponin i, poc: 0 ng/mL (ref 0.00–0.08)

## 2015-12-24 LAB — BASIC METABOLIC PANEL
Anion gap: 9 (ref 5–15)
BUN: 10 mg/dL (ref 6–20)
CO2: 24 mmol/L (ref 22–32)
CREATININE: 0.52 mg/dL — AB (ref 0.61–1.24)
Calcium: 9.1 mg/dL (ref 8.9–10.3)
Chloride: 107 mmol/L (ref 101–111)
GFR calc Af Amer: >60 mL/min (ref >60)
GLUCOSE: 89 mg/dL (ref 65–99)
POTASSIUM: 3.8 mmol/L (ref 3.5–5.1)
SODIUM: 140 mmol/L (ref 135–145)

## 2015-12-24 MED ORDER — ASPIRIN 81 MG PO CHEW
324.0000 mg | CHEWABLE_TABLET | Freq: Once | ORAL | Status: AC
  Administered 2015-12-24: 324 mg via ORAL
  Filled 2015-12-24: qty 4

## 2015-12-24 MED ORDER — ASPIRIN 81 MG PO CHEW: 324.0000 mg | CHEWABLE_TABLET | Freq: Once | ORAL | Status: DC

## 2015-12-24 NOTE — ED Notes (Signed)
Pt departed in NAD.  

## 2015-12-24 NOTE — ED Notes (Signed)
Pt reports central CP, lightheadedness, dizziness, SOB, onset a few weeks ago. He states the CP is worse when he moves his arms around. Denies N/V/D.

## 2015-12-24 NOTE — ED Provider Notes (Signed)
CSN: [id number]     Arrival date & time 12/24/15  1724 History   First MD Initiated Contact with Patient 12/24/15 1906     Chief Complaint  Patient presents with  . Chest Pain     (Consider location/radiation/quality/duration/timing/severity/associated sxs/prior Treatment) HPI   Blood pressure 92/59, pulse 62, temperature 97.8 F (36.6 C), temperature source Oral, resp. rate 16, SpO2 97 %.  Gary Burch is a 55 y.o. male complaining of ventral chest pain rated at 4 out of 10 at worst, exacerbated by movement and palpation, worse in the subxiphoid area, nonradiating and no associated symptoms of cough, fever, chills, shortness of breath, diaphoresis, nausea, vomiting. Pain onset last evening. Denies tobacco use, diabetes, hypertension, hyperlipidemia, family history of ACS. Patient states he's been feeling lightheaded when rising over the last several weeks. He is eating and drinking normally, denies melena, hematochezia, nausea or vomiting..   Past Medical History  Diagnosis Date  . Spine injury   . Cellulitis 01-2013    left leg  . Kidney stone   . UTI (lower urinary tract infection)   . Cataracts, bilateral   . Retinal tear     bilateral  . Heart murmur   . Pneumonia   . Cancer ([place])     skin cancer, basal cell carcinoma above eye brow  . Elevated liver enzymes     " once, as a response to medication"   Past Surgical History  Procedure Laterality Date  . Eye surgery      bilateral cataract  . Spine surgery  1981    fusion  . Colonoscopy with propofol N/A 03/23/2015    Procedure: COLONOSCOPY WITH PROPOFOL;  Surgeon: Milus Banister, MD;  Location: [facility];  Service: Endoscopy;  Laterality: N/A;  . Tonsillectomy    . Pars plana vitrectomy Right 11/10/2015    Procedure: PARS PLANA VITRECTOMY WITH 25 GAUGE with endolaser ;  Surgeon: Hurman Horn, MD;  Location: [facility];  Service: Ophthalmology;  Laterality: Right;  . Gas/fluid exchange Right 11/10/2015    Procedure:  GAS/FLUID EXCHANGE- C3F8 10% used;  Surgeon: Hurman Horn, MD;  Location: [place];  Service: Ophthalmology;  Laterality: Right;   Family History  Problem Relation Age of Onset  . Colon polyps Father   . Parkinson's disease Father   . Alzheimer's disease Father   . Hypertension Mother   . Heart disease Mother    Social History  Substance Use Topics  . Smoking status: Never Smoker   . Smokeless tobacco: Never Used  . Alcohol Use: 0.0 oz/week    0 Standard drinks or equivalent per week     Comment: occasional    Review of Systems  10 systems reviewed and found to be negative, except as noted in the HPI.   Allergies  Macrodantin and Sulfa antibiotics  Home Medications   Prior to Admission medications   Medication Sig Start Date End Date Taking? Authorizing Provider  baclofen (LIORESAL) 10 MG tablet Take 10 mg by mouth 4 (four) times daily.   Yes Levin Erp, MD  bethanechol (URECHOLINE) 25 MG tablet Take 50 mg by mouth 3 (three) times daily.   Yes Historical Provider, MD  Cetirizine-Pseudoephedrine (ZYRTEC-D PO) Take 1 tablet by mouth at bedtime.    Yes Historical Provider, MD  clindamycin-benzoyl peroxide (BENZACLIN) gel Apply 1 application topically daily as needed (for acne).    Yes Historical Provider, MD  Cranberry 450 MG TABS Take 1 tablet by mouth daily.  Yes Historical Provider, MD  Dapsone 5 % topical gel Apply 1 application topically 2 (two) times daily.    Yes Historical Provider, MD  diazepam (VALIUM) 2 MG tablet Take 2 mg by mouth 2 (two) times daily.   Yes Historical Provider, MD  diclofenac sodium (VOLTAREN) 1 % GEL Apply 1 application topically 3 (three) times daily. Patient taking differently: Apply 4 g topically 3 (three) times daily as needed.  02/06/12  Yes Meredith Staggers, MD  doxazosin (CARDURA) 4 MG tablet TAKE 1 1/2 TABLETS BY MOUTH AT BEDTIME Patient taking differently: Take 6 mg by mouth at bedtime.  09/24/14  Yes Meredith Staggers, MD  doxycycline  (MONODOX) 50 MG capsule Take 50 mg by mouth daily. 10/31/15  Yes Historical Provider, MD  DUREZOL 0.05 % EMUL Place 1 drop into the right eye 2 (two) times daily. 11/07/15  Yes Historical Provider, MD  Multiple Vitamin (MULTIVITAMIN WITH MINERALS) TABS tablet Take 1 tablet by mouth daily.   Yes Historical Provider, MD  mupirocin ointment (BACTROBAN) 2 % Apply 1 application topically daily as needed (for skin).  11/28/15  Yes Historical Provider, MD  pregabalin (LYRICA) 150 MG capsule Take 150 mg by mouth 2 (two) times daily.   Yes Historical Provider, MD  Probiotic Product (PROBIOTIC DAILY) CAPS Take 1 capsule by mouth daily.   Yes Historical Provider, MD  SF 5000 PLUS 1.1 % CREA dental cream Place 1 application onto teeth at bedtime.  11/28/15  Yes Historical Provider, MD  tazarotene (TAZORAC) 0.1 % gel Apply 1 application topically at bedtime.    Yes Historical Provider, MD  tobramycin (TOBREX) 0.3 % ophthalmic solution Place 1 drop into the right eye 4 (four) times daily. Reported on 12/24/2015 11/07/15   Historical Provider, MD  traMADol (ULTRAM) 50 MG tablet Take 0.5-1 tablets (25-50 mg total) by mouth every 6 (six) hours as needed for pain. Patient not taking: Reported on 12/24/2015 02/09/13   Meredith Staggers, MD   BP 76/50 mmHg  Pulse 66  Temp(Src) 97.8 F (36.6 C) (Oral)  Resp 19  SpO2 94% Physical Exam  Constitutional: He is oriented to person, place, and time. He appears well-developed and well-nourished. No distress.  HENT:  Head: Normocephalic.  Eyes: Conjunctivae and EOM are normal.  Cardiovascular: Normal rate.   Pulmonary/Chest: Effort normal. No stridor.  Musculoskeletal: Normal range of motion.  Neurological: He is alert and oriented to person, place, and time.  Psychiatric: He has a normal mood and affect.  Nursing note and vitals reviewed.   ED Course  Procedures (including critical care time) Labs Review Labs Reviewed  BASIC METABOLIC PANEL - Abnormal; Notable for the  following:    Creatinine, Ser 0.52 (*)    All other components within normal limits  CBC - Abnormal; Notable for the following:    RBC 4.18 (*)    All other components within normal limits  I-STAT TROPOININ, ED    Imaging Review Dg Chest 2 View  12/24/2015  CLINICAL DATA:  Chest pain EXAM: CHEST  2 VIEW COMPARISON:  None. FINDINGS: Narrow AP diameter of the chest with straightening of dorsal kyphosis. Pantopaque in the thoracic spinal canal. Negative for heart failure. Negative for pneumonia. No infiltrate or mass lesion. Apical densities bilaterally most consistent with apical scarring. IMPRESSION: No active cardiopulmonary disease. Electronically Signed   By: Franchot Gallo M.D.   On: 12/24/2015 18:32   I have personally reviewed and evaluated these images and lab results as  part of my medical decision-making.   EKG Interpretation   Date/Time:  Saturday December 24 2015 17:35:58 EST Ventricular Rate:  65 PR Interval:  148 QRS Duration: 82 QT Interval:  408 QTC Calculation: 424 R Axis:   80 Text Interpretation:  Normal sinus rhythm Septal infarct , age  undetermined No significant change since last tracing Confirmed by  [facility]  MD, WHITNEY ([id number]) on 12/24/2015 7:04:29 PM      MDM   Final diagnoses:  Chest wall pain    Filed Vitals:   12/24/15 1921 12/24/15 1930 12/24/15 2000 12/24/15 2049  BP: 105/69 122/89 86/69 76/50   Pulse: 63 65 66   Temp:      TempSrc:      Resp: 16 15 19    SpO2: 99% 99% 94%     Medications  aspirin chewable tablet 324 mg (324 mg Oral Given 12/24/15 1936)    Gary Burch is 55 y.o. male presenting with subxiphoid discomfort exacerbated by movement and palpation. EKG nonischemic, chest x-ray and blood work reassuring. He's had this pain since last night, this would be very atypical ACS. Considering that this patient is a quadriplegic consider the possibility of PE however he is saturating well on room air, not tachycardic, no pleuritic pain. He  is not taking any beta blockers would mask tachycardia. Charge patient follow closely with primary care.  Agent found to have a soft blood pressure with systolic in the [id number], we've rechecked this and he is 76/50 via manual cuff. Patient states he always runs low with a systolic in the [id number], about this patient IV hydration he declines.  Discussed case with attending physician who agrees with care plan and disposition.   Evaluation does not show pathology that would require ongoing emergent intervention or inpatient treatment. Pt is hemodynamically stable and mentating appropriately. Discussed findings and plan with patient/guardian, who agrees with care plan. All questions answered. Return precautions discussed and outpatient follow up given.       Elmyra Ricks Jovin Fester, PA-C 12/24/15 2054  Blanchie Dessert, MD 12/26/15 [phone]

## 2015-12-24 NOTE — Discharge Instructions (Signed)
Please follow with your primary care doctor in the next 2 days for a check-up. They must obtain records for further management.  ° °Do not hesitate to return to the Emergency Department for any new, worsening or concerning symptoms.  ° ° °Chest Wall Pain °Chest wall pain is pain in or around the bones and muscles of your chest. Sometimes, an injury causes this pain. Sometimes, the cause may not be known. This pain may take several weeks or longer to get better. °HOME CARE INSTRUCTIONS  °Pay attention to any changes in your symptoms. Take these actions to help with your pain:  °· Rest as told by your health care provider.   °· Avoid activities that cause pain. These include any activities that use your chest muscles or your abdominal and side muscles to lift heavy items.    °· If directed, apply ice to the painful area: °¨ Put ice in a plastic bag. °¨ Place a towel between your skin and the bag. °¨ Leave the ice on for 20 minutes, 2-3 times per day. °· Take over-the-counter and prescription medicines only as told by your health care provider. °· Do not use tobacco products, including cigarettes, chewing tobacco, and e-cigarettes. If you need help quitting, ask your health care provider. °· Keep all follow-up visits as told by your health care provider. This is important. °SEEK MEDICAL CARE IF: °· You have a fever. °· Your chest pain becomes worse. °· You have new symptoms. °SEEK IMMEDIATE MEDICAL CARE IF: °· You have nausea or vomiting. °· You feel sweaty or light-headed. °· You have a cough with phlegm (sputum) or you cough up blood. °· You develop shortness of breath. °  °This information is not intended to replace advice given to you by your health care provider. Make sure you discuss any questions you have with your health care provider. °  °Document Released: 11/05/2005 Document Revised: 07/27/2015 Document Reviewed: 01/31/2015 °Elsevier Interactive Patient Education ©2016 [employer] ° °

## 2016-01-25 ENCOUNTER — Ambulatory Visit: Payer: Medicare Other | Admitting: Podiatry

## 2016-02-09 ENCOUNTER — Ambulatory Visit (INDEPENDENT_AMBULATORY_CARE_PROVIDER_SITE_OTHER): Payer: Medicare Other | Admitting: Podiatry

## 2016-02-09 ENCOUNTER — Encounter: Payer: Self-pay | Admitting: Podiatry

## 2016-02-09 DIAGNOSIS — B351 Tinea unguium: Secondary | ICD-10-CM | POA: Diagnosis not present

## 2016-02-09 DIAGNOSIS — M79673 Pain in unspecified foot: Secondary | ICD-10-CM | POA: Diagnosis not present
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
## 2016-02-24 ENCOUNTER — Encounter (HOSPITAL_BASED_OUTPATIENT_CLINIC_OR_DEPARTMENT_OTHER): Payer: Medicare Other | Attending: Surgery

## 2016-03-12 ENCOUNTER — Encounter: Payer: Medicare Other | Admitting: Physical Medicine & Rehabilitation

## 2016-03-20 ENCOUNTER — Encounter: Payer: Self-pay | Admitting: Physical Medicine & Rehabilitation

## 2016-03-20 ENCOUNTER — Encounter: Payer: Medicare Other | Attending: Physical Medicine & Rehabilitation | Admitting: Physical Medicine & Rehabilitation

## 2016-03-20 VITALS — BP 79/51 | HR 64

## 2016-03-20 DIAGNOSIS — Z87440 Personal history of urinary (tract) infections: Secondary | ICD-10-CM | POA: Insufficient documentation

## 2016-03-20 DIAGNOSIS — R531 Weakness: Secondary | ICD-10-CM | POA: Diagnosis not present

## 2016-03-20 DIAGNOSIS — X58XXXD Exposure to other specified factors, subsequent encounter: Secondary | ICD-10-CM | POA: Insufficient documentation

## 2016-03-20 DIAGNOSIS — N319 Neuromuscular dysfunction of bladder, unspecified: Secondary | ICD-10-CM | POA: Diagnosis not present

## 2016-03-20 DIAGNOSIS — H409 Unspecified glaucoma: Secondary | ICD-10-CM | POA: Insufficient documentation

## 2016-03-20 DIAGNOSIS — M7711 Lateral epicondylitis, right elbow: Secondary | ICD-10-CM | POA: Diagnosis not present

## 2016-03-20 DIAGNOSIS — Z993 Dependence on wheelchair: Secondary | ICD-10-CM | POA: Insufficient documentation

## 2016-03-20 DIAGNOSIS — Z87442 Personal history of urinary calculi: Secondary | ICD-10-CM | POA: Diagnosis not present

## 2016-03-20 DIAGNOSIS — S14106D Unspecified injury at C6 level of cervical spinal cord, subsequent encounter: Secondary | ICD-10-CM | POA: Diagnosis present

## 2016-03-20 DIAGNOSIS — L03116 Cellulitis of left lower limb: Secondary | ICD-10-CM | POA: Diagnosis not present

## 2016-03-20 DIAGNOSIS — R011 Cardiac murmur, unspecified: Secondary | ICD-10-CM | POA: Insufficient documentation

## 2016-03-20 DIAGNOSIS — M75101 Unspecified rotator cuff tear or rupture of right shoulder, not specified as traumatic: Secondary | ICD-10-CM | POA: Insufficient documentation

## 2016-03-20 DIAGNOSIS — H579 Unspecified disorder of eye and adnexa: Secondary | ICD-10-CM | POA: Insufficient documentation

## 2016-03-20 DIAGNOSIS — D696 Thrombocytopenia, unspecified: Secondary | ICD-10-CM | POA: Diagnosis not present

## 2016-03-20 DIAGNOSIS — S14106S Unspecified injury at C6 level of cervical spinal cord, sequela: Secondary | ICD-10-CM

## 2016-03-20 DIAGNOSIS — Z981 Arthrodesis status: Secondary | ICD-10-CM | POA: Insufficient documentation

## 2016-03-20 DIAGNOSIS — G8250 Quadriplegia, unspecified: Secondary | ICD-10-CM | POA: Diagnosis not present

## 2016-03-20 DIAGNOSIS — R202 Paresthesia of skin: Secondary | ICD-10-CM | POA: Insufficient documentation

## 2016-03-20 DIAGNOSIS — Z8701 Personal history of pneumonia (recurrent): Secondary | ICD-10-CM | POA: Diagnosis not present

## 2016-03-20 DIAGNOSIS — Z85828 Personal history of other malignant neoplasm of skin: Secondary | ICD-10-CM | POA: Diagnosis not present

## 2016-03-20 NOTE — Progress Notes (Signed)
Subjective:    Patient ID: Gary Burch, male    DOB: April 22, 1961, 55 y.o.   MRN: [id number]  HPI   Gary Burch is here primarily for a wheelchair evaluation and in follow up of his C6 spinal cord injury. His chair is over 58 years old. He has made numerous modifications of his own to support his elbows. He has had problems with skin breakdown as well recently, particularly in his gluteal and sacral areas.    He has had eye problems bilaterally over the last year including glaucoma and a detached retina. He also struggles with cataracts. He reduced his lyrica to BID because of the visual issues as well as for his low platelet counts. I reviewed lab work from 12/2015 which revealed his platelet count wsa 176k.   Pain levels have been generally under control. He still complains of tingling in his hands.  He saw urology this week. U/S and check out was within normal limits.        Pain Inventory Average Pain 2 Pain Right Now 2 My pain is sharp and tingling  In the last 24 hours, has pain interfered with the following? General activity 1 Relation with others 1 Enjoyment of life 1 What TIME of day is your pain at its worst? daytime Sleep (in general) Fair  Pain is worse with: some activites Pain improves with: rest and heat/ice Relief from Meds: 3  Mobility ability to climb steps?  no do you drive?  no use a wheelchair needs help with transfers  Function not employed: date last employed never disabled: date disabled . I need assistance with the following:  dressing, bathing, toileting, meal prep, household duties and shopping  Neuro/Psych bladder control problems bowel control problems tingling spasms  Prior Studies Any changes since last visit?  no  Physicians involved in your care Any changes since last visit?  yes   Family History  Problem Relation Age of Onset  . Colon polyps Father   . Parkinson's disease Father   . Alzheimer's disease Father   .  Hypertension Mother   . Heart disease Mother    Social History   Social History  . Marital Status: Single    Spouse Name: N/A  . Number of Children: N/A  . Years of Education: N/A   Social History Main Topics  . Smoking status: Never Smoker   . Smokeless tobacco: Never Used  . Alcohol Use: 0.0 oz/week    0 Standard drinks or equivalent per week     Comment: occasional  . Drug Use: No  . Sexual Activity: No   Other Topics Concern  . None   Social History Narrative   Past Surgical History  Procedure Laterality Date  . Eye surgery      bilateral cataract  . Spine surgery  1981    fusion  . Colonoscopy with propofol N/A 03/23/2015    Procedure: COLONOSCOPY WITH PROPOFOL;  Surgeon: Milus Banister, MD;  Location: [facility];  Service: Endoscopy;  Laterality: N/A;  . Tonsillectomy    . Pars plana vitrectomy Right 11/10/2015    Procedure: PARS PLANA VITRECTOMY WITH 25 GAUGE with endolaser ;  Surgeon: Hurman Horn, MD;  Location: [facility];  Service: Ophthalmology;  Laterality: Right;  . Gas/fluid exchange Right 11/10/2015    Procedure: GAS/FLUID EXCHANGE- C3F8 10% used;  Surgeon: Hurman Horn, MD;  Location: [facility];  Service: Ophthalmology;  Laterality: Right;  . Retinal detachment surgery  nov  2016   Past Medical History  Diagnosis Date  . Spine injury   . Cellulitis 01-2013    left leg  . Kidney stone   . UTI (lower urinary tract infection)   . Cataracts, bilateral   . Retinal tear     bilateral  . Heart murmur   . Pneumonia   . Cancer ([place])     skin cancer, basal cell carcinoma above eye brow  . Elevated liver enzymes     " once, as a response to medication"   BP 79/51 mmHg  Pulse 64  SpO2 96%  Opioid Risk Score:   Fall Risk Score:  `1  Depression screen PHQ 2/9  No flowsheet data found.   Review of Systems     Objective:   Physical Exam  Constitutional: He is oriented to person, place, and time. He appears to have lost some weight HENT:  Head:  Normocephalic and atraumatic.  Eyes: Conjunctivae and EOM are normal.  Cardiovascular: Normal rate.  Pulmonary/Chest: Effort normal and breath sounds normal.  Abdominal: Soft.  Musculoskeletal: Neurological: He is alert and oriented to person, place, and time.  Exam is unchanged with weakness below the C6 level. He is sensory complete. Reflexes are 3+. Sustained clonus noted in both ankles is still present Psychiatric: He has a normal mood and affect. His behavior is normal. Judgment and thought content normal.     Assessment & Plan:   1. C6 spinal cord injury, complete.  2. Right lateral epicondylitis with ulnar nerve irritation  3. Right rotator cuff syndrome  4. Neurogenic bowel and bladder  5. Thrombocytopenia: likely due to lyrica (delayed response).    Plan:  1. Gary Burch is in need of a new powered wheelchair. He is unable to utilize a cane, walker, crutches, manual wheelchair or scooter due to his C6 complete spinal cord injury. He has had some breakdown recently in his skin and has lost some of weight over the last few years as well. He would benefit from a powered wheelchair with tilt and reclining capabilities as well as power elevating leg rests and adequate seating to support his trunk and protect his gluteal/sacral areas. He is motivated to use a powered wheelchair which allows independence with mobility and self-care activities within his home as well as mobility within the community. He is capable from a physical and cognitive standpoint to operate such a chair using a joystick control device.   2. Platelets back to 176kg in February. Recommend annual follow up.   3. Probiotics, fiber for stool consistency.   4. Continue with bowel program  5. Continue local skin care/observation. New seating as above. Discussed supplements including protein, vitamin c and zinc.  I'll see him back in about a year. 15 minutes of face to face patient care time were spent during this visit.  All questions were encouraged and answered.

## 2016-03-20 NOTE — Patient Instructions (Signed)
  PLEASE CALL ME WITH ANY PROBLEMS OR QUESTIONS (#[phone]).      

## 2016-05-02 ENCOUNTER — Encounter (HOSPITAL_BASED_OUTPATIENT_CLINIC_OR_DEPARTMENT_OTHER): Payer: Self-pay

## 2016-05-02 ENCOUNTER — Encounter (HOSPITAL_BASED_OUTPATIENT_CLINIC_OR_DEPARTMENT_OTHER): Payer: Medicare Other | Attending: Surgery

## 2016-05-02 DIAGNOSIS — G8253 Quadriplegia, C5-C7 complete: Secondary | ICD-10-CM | POA: Diagnosis not present

## 2016-05-02 DIAGNOSIS — L89311 Pressure ulcer of right buttock, stage 1: Secondary | ICD-10-CM | POA: Diagnosis present

## 2016-05-02 DIAGNOSIS — Z85828 Personal history of other malignant neoplasm of skin: Secondary | ICD-10-CM | POA: Insufficient documentation

## 2016-05-09 ENCOUNTER — Ambulatory Visit: Payer: Medicare Other | Admitting: Podiatry

## 2016-05-10 ENCOUNTER — Encounter: Payer: Self-pay | Admitting: Podiatry

## 2016-05-10 ENCOUNTER — Ambulatory Visit (INDEPENDENT_AMBULATORY_CARE_PROVIDER_SITE_OTHER): Payer: Medicare Other | Admitting: Podiatry

## 2016-05-10 DIAGNOSIS — B351 Tinea unguium: Secondary | ICD-10-CM

## 2016-05-10 DIAGNOSIS — M79676 Pain in unspecified toe(s): Secondary | ICD-10-CM
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
## 2016-05-23 ENCOUNTER — Ambulatory Visit: Payer: Medicare Other | Attending: Internal Medicine | Admitting: Physical Therapy

## 2016-05-23 DIAGNOSIS — M6281 Muscle weakness (generalized): Secondary | ICD-10-CM | POA: Diagnosis present

## 2016-05-23 DIAGNOSIS — G8253 Quadriplegia, C5-C7 complete: Secondary | ICD-10-CM | POA: Diagnosis not present

## 2016-05-23 DIAGNOSIS — R2689 Other abnormalities of gait and mobility: Secondary | ICD-10-CM | POA: Diagnosis present

## 2016-05-23 NOTE — Therapy (Signed)
[facility] [address] [place], [place], [address] Phone: [phone]   Fax:  [phone]  Physical Therapy Evaluation  Patient Details  Name: Gary Burch MRN: [id number] Date of Birth: 08-Dec-1960 Referring Provider: Dr. Alger Simons  Encounter Date: 05/23/2016      PT End of Session - 05/23/16 2217    Visit Number 1   Number of Visits 1   Authorization Type Medicare   PT Start Time [id number]   PT Stop Time 1428   PT Time Calculation (min) 73 min      Past Medical History  Diagnosis Date  . Spine injury   . Cellulitis 01-2013    left leg  . Kidney stone   . UTI (lower urinary tract infection)   . Cataracts, bilateral   . Retinal tear     bilateral  . Heart murmur   . Pneumonia   . Cancer ([place])     skin cancer, basal cell carcinoma above eye brow  . Elevated liver enzymes     " once, as a response to medication"    Past Surgical History  Procedure Laterality Date  . Eye surgery      bilateral cataract  . Spine surgery  1981    fusion  . Colonoscopy with propofol N/A 03/23/2015    Procedure: COLONOSCOPY WITH PROPOFOL;  Surgeon: Milus Banister, MD;  [facility];  Service: Endoscopy;  Laterality: N/A;  . Tonsillectomy    . Pars plana vitrectomy Right 11/10/2015    Procedure: PARS PLANA VITRECTOMY WITH 25 GAUGE with endolaser ;  Surgeon: Hurman Horn, MD;  Location: [facility];  Service: Ophthalmology;  Laterality: Right;  . Gas/fluid exchange Right 11/10/2015    Procedure: GAS/FLUID EXCHANGE- C3F8 10% used;  Surgeon: Hurman Horn, MD;  Location: [facility];  Service: Ophthalmology;  Laterality: Right;  . Retinal detachment surgery  nov  2016    There were no vitals filed for this visit.       Subjective Assessment - 05/23/16 2215    Subjective Pt requesting new power wheelchair - [employer] is company working with pt   Currently in Pain? No/denies            [facility] PT Assessment -  05/23/16 0001    Assessment   Medical Diagnosis C5-7 SCI   Referring Provider Dr. Alger Simons   Onset Date/Surgical Date --  1981   Precautions   Precautions Fall   Balance Screen   Has the patient fallen in the past 6 months No   Has the patient had a decrease in activity level because of a fear of falling?  No   Is the patient reluctant to leave their home because of a fear of falling?  No      Wheelchair eval to be completed after vendor completes pressure mapping to determine most appropriate cushion; pt is also trialing different Wheelchairs to determine which one he prefers to order/obtain                                   Plan - 05/23/16 2218    Clinical Impression Statement Pt is a 55 year old male s/p C5-7 SCI with quadriplegia/quadriparesis.  Pt is in need of a new power wheelchair - recommend [employer] - pt to be pressure mapped to determine most appropriate cushion due to h/o skin breakdown  PT Frequency 1x / week   PT Duration --  eval only   PT Treatment/Interventions Patient/family education;ADLs/Self Care Home Management;Other (comment)  power wheelchair evaluation   PT Next Visit Plan N/A - eval only   Consulted and Agree with Plan of Care Patient      Patient will benefit from skilled therapeutic intervention in order to improve the following deficits and impairments:  Decreased mobility, Decreased strength  Visit Diagnosis: Quadriplegia, C5-C7 complete ([place]) - Plan: PT plan of care cert/re-cert  Muscle weakness (generalized) - Plan: PT plan of care cert/re-cert  Other abnormalities of gait and mobility - Plan: PT plan of care cert/re-cert      G-Codes - [id number] Feb 27, 2221    Functional Assessment Tool Used pt has quadriplegia due - inability to stand/amb. or sit unsupported   Functional Limitation Mobility: Walking and moving around   Mobility: Walking and Moving Around Current Status [phone]) At least 80 percent but less than  100 percent impaired, limited or restricted   Mobility: Walking and Moving Around Goal Status [phone]) At least 80 percent but less than 100 percent impaired, limited or restricted   Mobility: Walking and Moving Around Discharge Status [phone]) At least 80 percent but less than 100 percent impaired, limited or restricted       Problem List Patient Active Problem List   Diagnosis Date Noted  . Onychomycosis 10/19/2015  . Quadriplegia ([place]) 03/22/2015  . Neurogenic bladder 02/09/2013  . Neurogenic bowel 02/09/2013  . C6 spinal cord injury ([place]) 02/06/2012  . Injury of ulnar nerve at right forearm level 02/06/2012  . Rotator cuff syndrome of right shoulder 02/06/2012  . Right lateral epicondylitis 02/06/2012    Adnan Vanvoorhis, [place], PT 05/23/2016, 10:26 PM  [facility] [address] [place], [place], [address] Phone: [phone]   Fax:  [phone]  Name: Gary Burch MRN: [id number] Date of Birth: 1961-02-18

## 2016-07-06 ENCOUNTER — Telehealth: Payer: Self-pay | Admitting: Gastroenterology

## 2016-07-06 NOTE — Telephone Encounter (Signed)
Left message on machine to call back  

## 2016-07-09 NOTE — Telephone Encounter (Signed)
Left message on machine to call back  

## 2016-07-10 NOTE — Telephone Encounter (Signed)
Left message on machine to call back will wait for pt to return call

## 2016-07-20 ENCOUNTER — Telehealth: Payer: Self-pay | Admitting: Gastroenterology

## 2016-07-20 NOTE — Telephone Encounter (Signed)
Anytime after 3pm

## 2016-07-20 NOTE — Telephone Encounter (Signed)
Patient returned phone call. °

## 2016-07-26 NOTE — Telephone Encounter (Signed)
Left message on machine to call back will wait for further communication from the pt  

## 2016-07-27 NOTE — Telephone Encounter (Signed)
Change in bowels x 2 months, "mushy stools"  Left sided abd pain.  Gurgling noise in the stomach.  Appt made for 07/31/16, he will call if the weather prevents him from coming in.  I left the original appt for 10/02/16 in the system in case he cant make the 07/31/16 appt.

## 2016-07-27 NOTE — Telephone Encounter (Signed)
Patient called in again regarding this. Patient is requesting a callback today. Best # [phone]

## 2016-07-31 ENCOUNTER — Encounter: Payer: Self-pay | Admitting: Gastroenterology

## 2016-07-31 ENCOUNTER — Ambulatory Visit (INDEPENDENT_AMBULATORY_CARE_PROVIDER_SITE_OTHER): Payer: Medicare Other | Admitting: Gastroenterology

## 2016-07-31 VITALS — BP 80/42 | HR 80

## 2016-07-31 DIAGNOSIS — R197 Diarrhea, unspecified: Secondary | ICD-10-CM | POA: Diagnosis not present

## 2016-07-31 DIAGNOSIS — R194 Change in bowel habit: Secondary | ICD-10-CM

## 2016-07-31 NOTE — Progress Notes (Signed)
Review of pertinent gastrointestinal problems: 1. Routine risk for colon cancer: normal colonsocopy 03/2016 (done for cologuard + stool testing)  HPI: This is a  very pleasant quadriplegic with limited upper extremity mobilities 55 year old man whom I last saw 4 months ago the time of colonoscopy. See those results summarized above  Chief complaint is change in bowel habits, softer than usual stools  Colonoscopy 03/2016;  Has been having softer than usual stools, some episodes of very mushy stools.    He stopped antibiotics as best that he could.  Started prilosec over the summer;    In the past 6 to 8 weeks, very mushy stools.  Can go several days in a row of larger than usual volume of stools  Hearing a lot of rumbling in his abd.  Feels gurgles.    Took some pepto recently.  During these looser than usual stools he usually has a runny nose at same time.  ROS: complete GI ROS as described in HPI.  Constitutional:  No unintentional weight loss   Past Medical History:  Diagnosis Date  . Cancer ([place])    skin cancer, basal cell carcinoma above eye brow  . Cataracts, bilateral   . Cellulitis 01-2013   left leg  . Elevated liver enzymes    " once, as a response to medication"  . Heart murmur   . Kidney stone   . Pneumonia   . Retinal tear    bilateral  . Spine injury   . UTI (lower urinary tract infection)     Past Surgical History:  Procedure Laterality Date  . COLONOSCOPY WITH PROPOFOL N/A 03/23/2015   Procedure: COLONOSCOPY WITH PROPOFOL;  Surgeon: Milus Banister, MD;  [facility];  Service: Endoscopy;  Laterality: N/A;  . EYE SURGERY     bilateral cataract  . GAS/FLUID EXCHANGE Right 11/10/2015   Procedure: GAS/FLUID EXCHANGE- C3F8 10% used;  Surgeon: Hurman Horn, MD;  Location: [facility];  Service: Ophthalmology;  Laterality: Right;  . PARS PLANA VITRECTOMY Right 11/10/2015   Procedure: PARS PLANA VITRECTOMY WITH 25 GAUGE with endolaser ;  Surgeon: Hurman Horn, MD;  Location: [facility];  Service: Ophthalmology;  Laterality: Right;  . RETINAL DETACHMENT SURGERY  nov  2016  . [place]   fusion  . TONSILLECTOMY      Current Outpatient Prescriptions  Medication Sig Dispense Refill  . baclofen (LIORESAL) 10 MG tablet Take 10 mg by mouth 4 (four) times daily.    . bethanechol (URECHOLINE) 25 MG tablet Take 50 mg by mouth 3 (three) times daily.    . Cetirizine-Pseudoephedrine (ZYRTEC-D PO) Take 1 tablet by mouth at bedtime.     . clindamycin-benzoyl peroxide (BENZACLIN) gel Apply 1 application topically daily as needed (for acne).     . Cranberry 450 MG TABS Take 1 tablet by mouth daily.    . Dapsone 5 % topical gel Apply 1 application topically 2 (two) times daily.     . diazepam (VALIUM) 2 MG tablet Take 2 mg by mouth 2 (two) times daily.    Marland Kitchen doxazosin (CARDURA) 4 MG tablet TAKE 1 1/2 TABLETS BY MOUTH AT BEDTIME (Patient taking differently: Take 6 mg by mouth at bedtime. ) 45 tablet 6  . Multiple Vitamin (MULTIVITAMIN WITH MINERALS) TABS tablet Take 1 tablet by mouth daily.    . mupirocin ointment (BACTROBAN) 2 % Apply 1 application topically daily as needed (for skin).   2  . pregabalin (LYRICA)  150 MG capsule Take 150 mg by mouth 2 (two) times daily.    . Probiotic Product (PROBIOTIC DAILY) CAPS Take 1 capsule by mouth daily.    . SF 5000 PLUS 1.1 % CREA dental cream Place 1 application onto teeth at bedtime.   4  . tazarotene (TAZORAC) 0.1 % gel Apply 1 application topically at bedtime.      No current facility-administered medications for this visit.     Allergies as of 07/31/2016 - Review Complete 07/31/2016  Allergen Reaction Noted  . Macrodantin Rash 01/30/2012  . Sulfa antibiotics Rash 01/30/2012    Family History  Problem Relation Age of Onset  . Colon polyps Father   . Parkinson's disease Father   . Alzheimer's disease Father   . Hypertension Mother   . Heart disease Mother     Social History   Social History   . Marital status: Single    Spouse name: N/A  . Number of children: N/A  . Years of education: N/A   Occupational History  . Not on file.   Social History Main Topics  . Smoking status: Never Smoker  . Smokeless tobacco: Never Used  . Alcohol use 0.0 oz/week     Comment: occasional  . Drug use: No  . Sexual activity: No   Other Topics Concern  . Not on file   Social History Narrative  . No narrative on file     Physical Exam: BP (!) 80/42 (BP Location: Right Arm, Patient Position: Sitting, Cuff Size: Normal)   Pulse 80  Constitutional: generally well-appearing Psychiatric: alert and oriented x3 Abdomen: soft, nontender, nondistended, no obvious ascites, no peritoneal signs, normal bowel sounds No peripheral edema noted in lower extremities  Assessment and plan: 55 y.o. male with Change in bowel habits, loose to soft bowel movements  Unclear etiology however he had a normal colonoscopy just 4 months ago. Possibly this is related to intermittent antibiotic use. Going to get a stool pathogen panel sent and for now he will start a single Imodium shortly after waking up every morning. Stool pathogen panel was negative and he is not feeling better on a single Imodium once daily then I will likely try a weeklong course of empiric Flagyl     Owens Loffler, MD [facility] 07/31/2016, 2:12 PM

## 2016-07-31 NOTE — Patient Instructions (Signed)
You will have labs checked today in the basement lab.  Please head down after you check out with the front desk  ( stool for Gi pathogen panel). Imodium one pill once daily in AM for now.

## 2016-08-01 ENCOUNTER — Other Ambulatory Visit: Payer: Medicare Other

## 2016-08-01 DIAGNOSIS — R197 Diarrhea, unspecified: Secondary | ICD-10-CM

## 2016-08-03 LAB — GASTROINTESTINAL PATHOGEN PANEL PCR
C. DIFFICILE TOX A/B, PCR: NOT DETECTED
CAMPYLOBACTER, PCR: NOT DETECTED
CRYPTOSPORIDIUM, PCR: NOT DETECTED
E COLI 0157, PCR: NOT DETECTED
E coli (ETEC) LT/ST PCR: NOT DETECTED
E coli (STEC) stx1/stx2, PCR: NOT DETECTED
GIARDIA LAMBLIA, PCR: NOT DETECTED
Norovirus, PCR: NOT DETECTED
ROTAVIRUS, PCR: NOT DETECTED
Salmonella, PCR: NOT DETECTED
Shigella, PCR: NOT DETECTED

## 2016-08-08 ENCOUNTER — Encounter: Payer: Self-pay | Admitting: Podiatry

## 2016-08-08 ENCOUNTER — Ambulatory Visit (INDEPENDENT_AMBULATORY_CARE_PROVIDER_SITE_OTHER): Payer: Medicare Other | Admitting: Podiatry

## 2016-08-08 VITALS — BP 82/46 | HR 77 | Resp 14

## 2016-08-08 DIAGNOSIS — M79676 Pain in unspecified toe(s): Secondary | ICD-10-CM

## 2016-08-08 DIAGNOSIS — B351 Tinea unguium: Secondary | ICD-10-CM
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
## 2016-08-17 ENCOUNTER — Telehealth: Payer: Self-pay | Admitting: Gastroenterology

## 2016-08-21 NOTE — Telephone Encounter (Signed)
Pt has been notified that the office note was sent to Dr Carlota Raspberry

## 2016-10-02 ENCOUNTER — Ambulatory Visit: Payer: Medicare Other | Admitting: Gastroenterology

## 2016-10-03 ENCOUNTER — Other Ambulatory Visit: Payer: Self-pay | Admitting: Internal Medicine

## 2016-10-03 DIAGNOSIS — M79605 Pain in left leg: Secondary | ICD-10-CM

## 2016-10-04 ENCOUNTER — Ambulatory Visit
Admission: RE | Admit: 2016-10-04 | Discharge: 2016-10-04 | Disposition: A | Discharge status: Home / Self Care | Payer: Medicare Other | Source: Ambulatory Visit | Attending: Internal Medicine | Admitting: Internal Medicine

## 2016-10-04 DIAGNOSIS — M79605 Pain in left leg: Secondary | ICD-10-CM

## 2016-10-16 NOTE — Addendum Note (Signed)
Addended by: Deloria Lair A on: 10/16/2016 04:31 PM   Modules accepted: Orders

## 2016-10-18 IMAGING — CT CT ABD-PELV W/ CM
2 of 5 series · 16 of 46 positions shown, 18 images · IV contrast (omnipaque)
Comparison: 05/02/2007.

CLINICAL DATA: Left lower quadrant pain.  Fall

EXAM:
CT ABDOMEN AND PELVIS WITH CONTRAST
TECHNIQUE: Multidetector CT imaging of the abdomen and pelvis was performed
using the standard protocol following bolus administration of
intravenous contrast.
CONTRAST:  100mL OMNIPAQUE IOHEXOL 300 MG/ML  SOLN

[Series 2: abd/ pelvis 5.0 i30f 1 · axial · 0.96mm/px · z∈[+1141,+1546]mm · 13 of 91 slices shown, 15 images]
[im 5/91  soft-tissue]
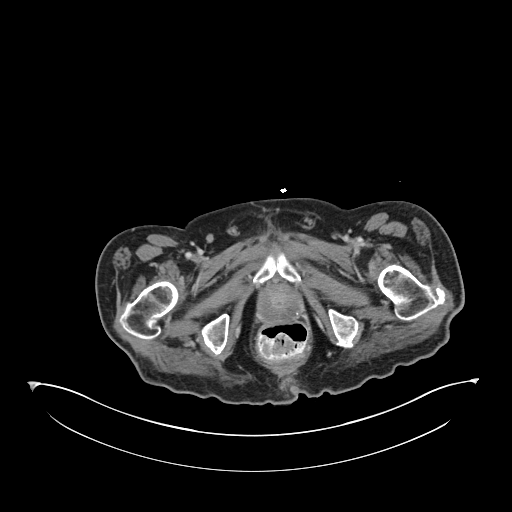
[im 5/91  bone]
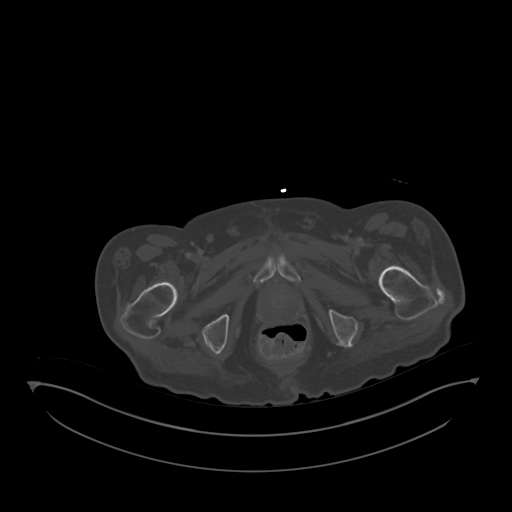
[im 15/91  soft-tissue]
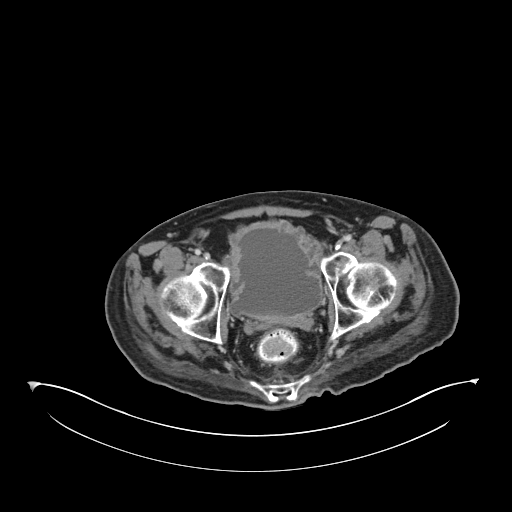
[im 19/91  soft-tissue]
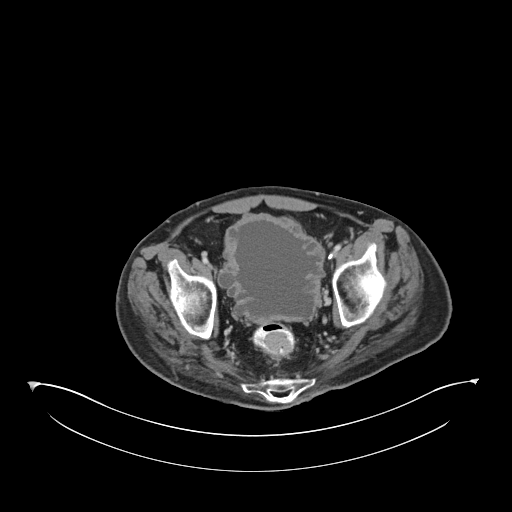
[im 24/91  soft-tissue]
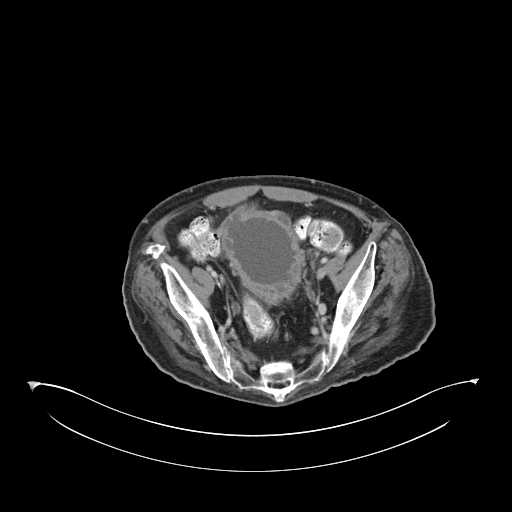
[im 34/91  soft-tissue]
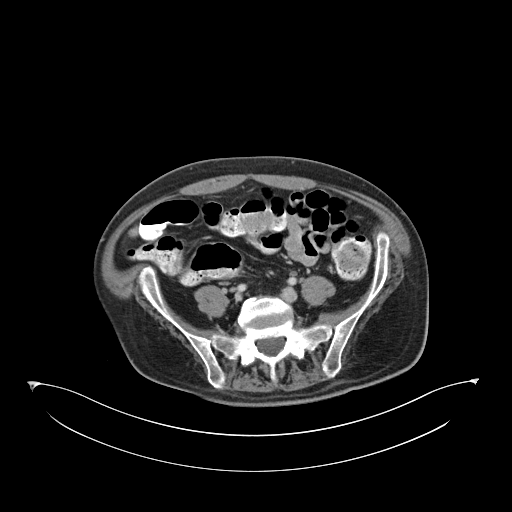
[im 38/91  soft-tissue]
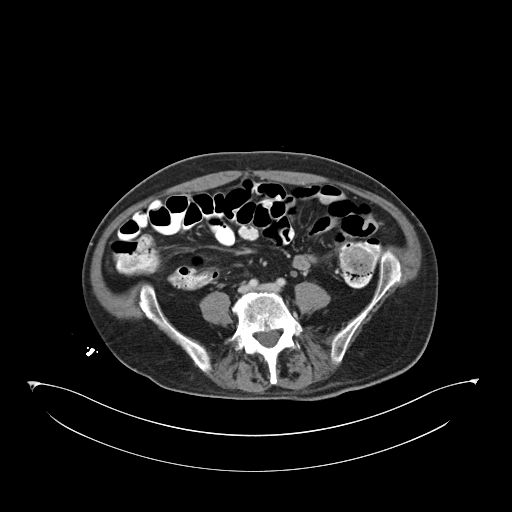
[im 48/91  soft-tissue]
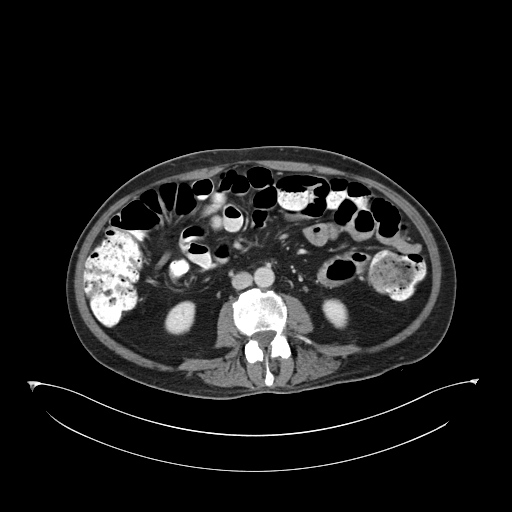
[im 53/91  soft-tissue]
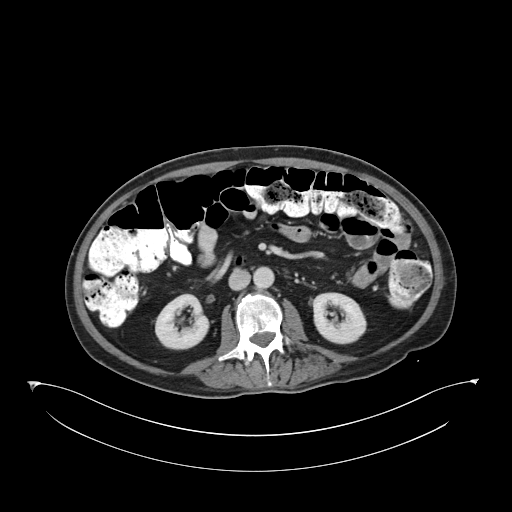
[im 57/91  soft-tissue]
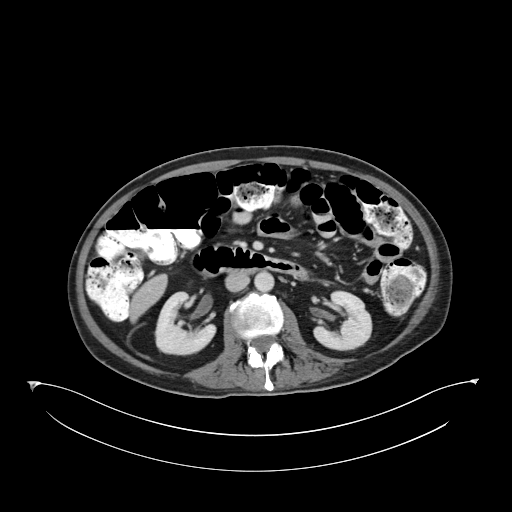
[im 57/91  bone]
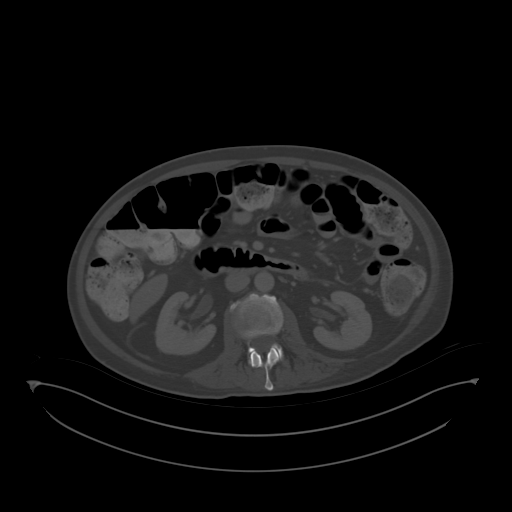
[im 67/91  soft-tissue]
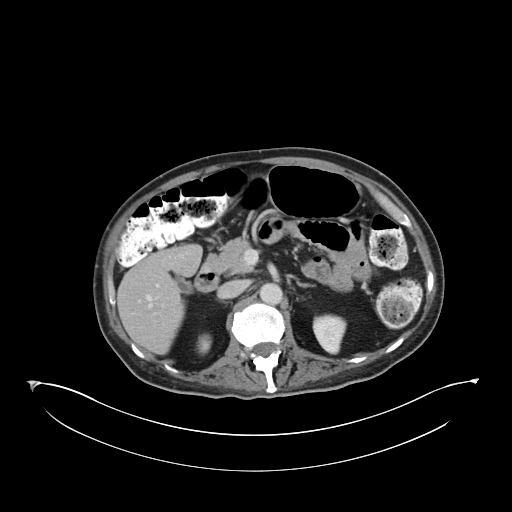
[im 72/91  soft-tissue]
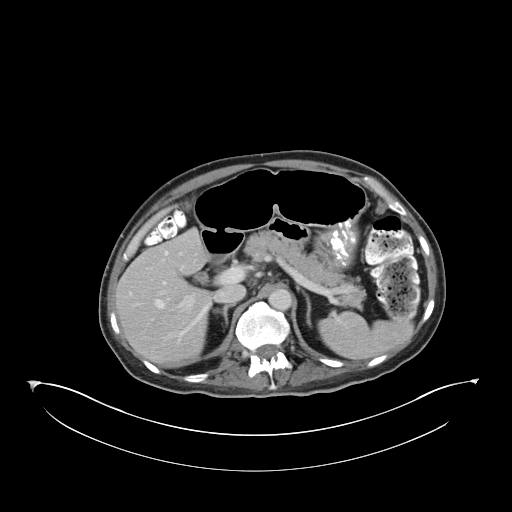
[im 76/91  soft-tissue]
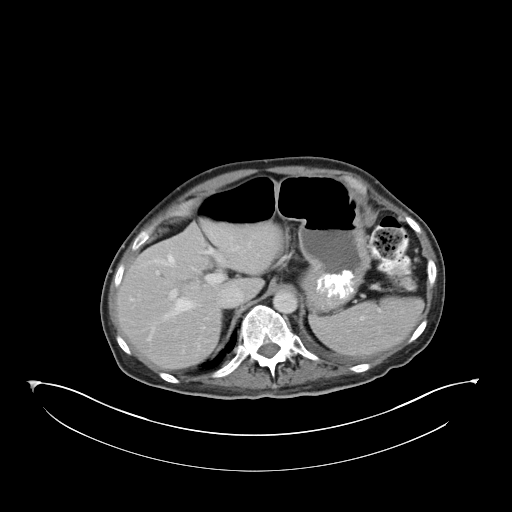
[im 86/91  soft-tissue]
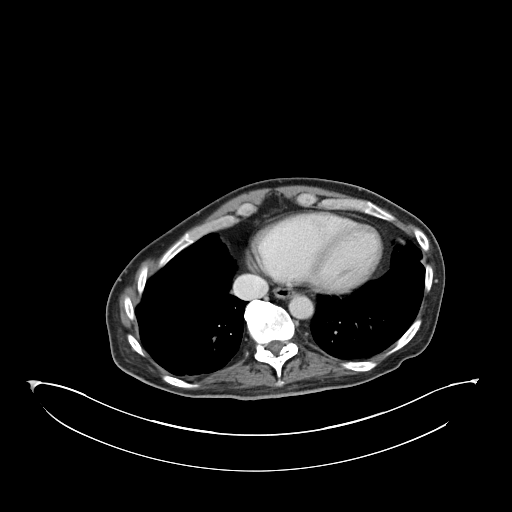

[Series 5: coronals · coronal · 0.80mm/px · 3 of 150 slices shown]
[im 50/150  soft-tissue]
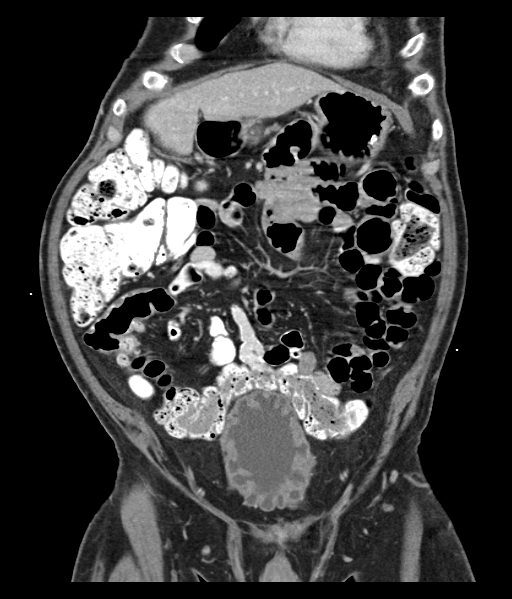
[im 67/150  soft-tissue]
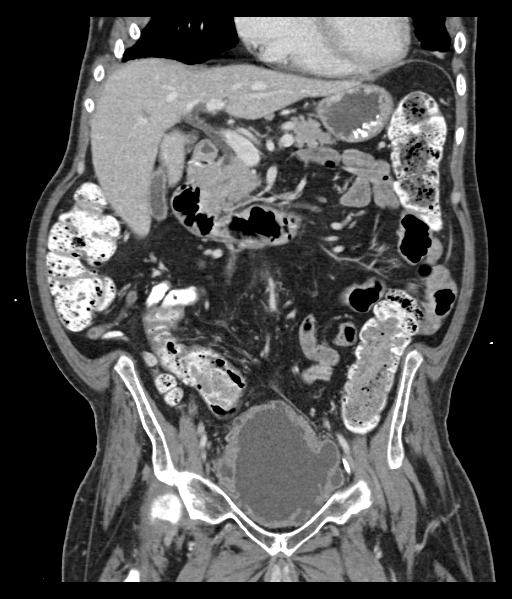
[im 83/150  soft-tissue]
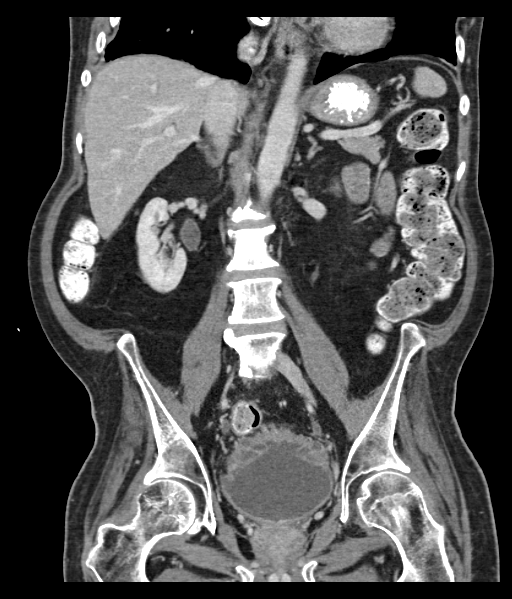

[16 of 46 positions shown; findings below may reference images not displayed]

FINDINGS: Lower chest:  No pleural effusion identified.

Hepatobiliary: There is no suspicious liver abnormality. The
gallbladder appears normal.

Pancreas: Normal appearance of the pancreas.

Spleen: Negative.

Adrenals/Urinary Tract: The adrenal glands are both normal. Tiny
nonobstructing right renal calculus is node measuring 2-3 mm. No
right hydronephrosis or hydroureter. Normal appearance of the left
kidney. The urinary bladder appears abnormal. The wall is diffusely
trabeculated with numerous diverticula. The appearance is similar to
previous exam. No focal filling defects identified within the
bladder lumen.

Stomach/Bowel: Normal appearance of the stomach. The small bowel
loops have a normal caliber without evidence for bowel obstruction.
Normal appearance the proximal appendix appears normal. There is
mild thickening and mucosal enhancement involving the distal aspect
of the appendix which has a maximum diameter of 9 mm. On the
previous study from 6996 the appendix had a maximum diameter of
cm. No periappendiceal fat stranding or free fluid identified. The
colon appears within normal limits. Evidence of rectocele noted.

Vascular/Lymphatic: Normal appearance stress that mild calcified
atherosclerotic change involves the distal abdominal aorta. No
aneurysm. No enlarged retroperitoneal or mesenteric adenopathy. No
enlarged pelvic or inguinal lymph nodes.

Reproductive: Postoperative defect within the central portion of the
prostate gland suggests prior TURP.

Other: No free fluid or fluid collections identified.

Musculoskeletal: Review of the visualized osseous structures is
unremarkable.
IMPRESSION: 1. No acute findings within the abdomen or pelvis. No explanation
for left lower quadrant pain.
2. Mild thickening of the distal appendix which measures up to 9 mm.
No periappendiceal fat stranding or free fluid. Compared with study
from 6996 this is improved from 12 mm.
3. Bladder wall thickening containing numerous diverticula
compatible with neurogenic bladder.
4. Suspect rectocele.

## 2016-11-28 ENCOUNTER — Encounter: Payer: Self-pay | Admitting: Podiatry

## 2016-11-28 ENCOUNTER — Ambulatory Visit (INDEPENDENT_AMBULATORY_CARE_PROVIDER_SITE_OTHER): Payer: Medicare Other | Admitting: Podiatry

## 2016-11-28 DIAGNOSIS — B351 Tinea unguium: Secondary | ICD-10-CM
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
## 2017-02-06 ENCOUNTER — Ambulatory Visit (HOSPITAL_BASED_OUTPATIENT_CLINIC_OR_DEPARTMENT_OTHER): Payer: Self-pay

## 2017-02-13 ENCOUNTER — Ambulatory Visit: Payer: Medicare Other | Admitting: Podiatry

## 2017-02-27 ENCOUNTER — Ambulatory Visit (INDEPENDENT_AMBULATORY_CARE_PROVIDER_SITE_OTHER): Payer: Self-pay | Admitting: Podiatry

## 2017-02-27 DIAGNOSIS — B351 Tinea unguium: Secondary | ICD-10-CM

## 2017-02-27 DIAGNOSIS — M79676 Pain in unspecified toe(s): Secondary | ICD-10-CM
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
## 2017-03-19 ENCOUNTER — Encounter: Payer: Self-pay | Admitting: Physical Medicine & Rehabilitation

## 2017-03-19 ENCOUNTER — Encounter: Payer: Medicare Other | Attending: Physical Medicine & Rehabilitation | Admitting: Physical Medicine & Rehabilitation

## 2017-03-19 VITALS — BP 72/37 | HR 68

## 2017-03-19 DIAGNOSIS — N319 Neuromuscular dysfunction of bladder, unspecified: Secondary | ICD-10-CM | POA: Insufficient documentation

## 2017-03-19 DIAGNOSIS — D696 Thrombocytopenia, unspecified: Secondary | ICD-10-CM | POA: Insufficient documentation

## 2017-03-19 DIAGNOSIS — Z7901 Long term (current) use of anticoagulants: Secondary | ICD-10-CM | POA: Diagnosis not present

## 2017-03-19 DIAGNOSIS — G8250 Quadriplegia, unspecified: Secondary | ICD-10-CM | POA: Diagnosis not present

## 2017-03-19 DIAGNOSIS — X58XXXA Exposure to other specified factors, initial encounter: Secondary | ICD-10-CM | POA: Insufficient documentation

## 2017-03-19 DIAGNOSIS — M75101 Unspecified rotator cuff tear or rupture of right shoulder, not specified as traumatic: Secondary | ICD-10-CM | POA: Insufficient documentation

## 2017-03-19 DIAGNOSIS — S14106S Unspecified injury at C6 level of cervical spinal cord, sequela: Secondary | ICD-10-CM | POA: Diagnosis not present

## 2017-03-19 DIAGNOSIS — S14106A Unspecified injury at C6 level of cervical spinal cord, initial encounter: Secondary | ICD-10-CM | POA: Insufficient documentation

## 2017-03-19 DIAGNOSIS — M7711 Lateral epicondylitis, right elbow: Secondary | ICD-10-CM | POA: Diagnosis not present

## 2017-03-19 DIAGNOSIS — I82403 Acute embolism and thrombosis of unspecified deep veins of lower extremity, bilateral: Secondary | ICD-10-CM | POA: Insufficient documentation

## 2017-03-19 NOTE — Progress Notes (Signed)
Subjective:    Patient ID: Gary Burch, male    DOB: 11-24-1960, 56 y.o.   MRN: [id number]  HPI   Gary Burch is here in follow up of his C6 spinal cord injury. Gary Burch received his new power chair after we last met and it's working out very well for him. Gary Burch has noticed improved trunk control as well as better positioning and padding for elbows.   Gary Burch was diagnosed with bilateral lower ext DVT's last November and is now on xarelto which Gary Burch's tolerated well. Gary Burch reports that Gary Burch bleeds a little bit easier but no serious sequelae have been experienced.   His bowel program has been better off abx and using imodium as well as helped per recommendation of GI . Gary Burch has noticed more difficulties passing his urinary cath recently and has a follow up appt scheduled next month with urology.      Pain Inventory Average Pain 2 Pain Right Now 2 My pain is constant  In the last 24 hours, has pain interfered with the following? General activity 3 Relation with others 2 Enjoyment of life 2 What TIME of day is your pain at its worst? daytime Sleep (in general) Fair  Pain is worse with: sitting Pain improves with: rest and heat/ice Relief from Meds: 1  Mobility use a wheelchair needs help with transfers  Function disabled: date disabled . I need assistance with the following:  dressing, bathing, toileting, meal prep, household duties and shopping  Neuro/Psych tingling spasms  Prior Studies Any changes since last visit?  no  Physicians involved in your care Any changes since last visit?  no   Family History  Problem Relation Age of Onset  . Colon polyps Father   . Parkinson's disease Father   . Alzheimer's disease Father   . Hypertension Mother   . Heart disease Mother    Social History   Social History  . Marital status: Single    Spouse name: N/A  . Number of children: N/A  . Years of education: N/A   Social History Main Topics  . Smoking status: Never Smoker  . Smokeless  tobacco: Never Used  . Alcohol use 0.0 oz/week     Comment: occasional  . Drug use: No  . Sexual activity: No   Other Topics Concern  . None   Social History Narrative  . None   Past Surgical History:  Procedure Laterality Date  . COLONOSCOPY WITH PROPOFOL N/A 03/23/2015   Procedure: COLONOSCOPY WITH PROPOFOL;  Surgeon: Milus Banister, MD;  Location: [facility];  Service: Endoscopy;  Laterality: N/A;  . EYE SURGERY     bilateral cataract  . GAS/FLUID EXCHANGE Right 11/10/2015   Procedure: GAS/FLUID EXCHANGE- C3F8 10% used;  Surgeon: Hurman Horn, MD;  Location: [place];  Service: Ophthalmology;  Laterality: Right;  . PARS PLANA VITRECTOMY Right 11/10/2015   Procedure: PARS PLANA VITRECTOMY WITH 25 GAUGE with endolaser ;  Surgeon: Hurman Horn, MD;  Location: [place];  Service: Ophthalmology;  Laterality: Right;  . RETINAL DETACHMENT SURGERY  nov  2016  . [place]   fusion  . TONSILLECTOMY     Past Medical History:  Diagnosis Date  . Cancer ([place])    skin cancer, basal cell carcinoma above eye brow  . Cataracts, bilateral   . Cellulitis 01-2013   left leg  . Elevated liver enzymes    " once, as a response to medication"  . Heart murmur   .  Kidney stone   . Pneumonia   . Retinal tear    bilateral  . Spine injury   . UTI (lower urinary tract infection)    BP (!) 72/37   Pulse 68   SpO2 97%   Opioid Risk Score:   Fall Risk Score:  `1  Depression screen PHQ 2/9  No flowsheet data found. Review of Systems  Constitutional: Negative.   HENT: Negative.   Eyes: Negative.   Respiratory: Negative.   Cardiovascular: Negative.   Gastrointestinal: Negative.   Endocrine: Negative.   Genitourinary: Negative.   Musculoskeletal: Negative.   Skin: Negative.   Allergic/Immunologic: Negative.   Neurological: Negative.   Hematological: Bruises/bleeds easily.  Psychiatric/Behavioral: Negative.   All other systems reviewed and are negative.      Objective:    Physical Exam  Constitutional: Gary Burch is oriented to person, place, and time. Gary Burch appears to have lost some weight HENT:  Head: Normocephalic and atraumatic.  Eyes: Conjunctivae and EOM are normal.  Cardiovascular: RRR.  Pulmonary/Chest: normal effort.  Abdominal: Soft. Non-tender Musculoskeletal: Neurological: Gary Burch is alert and oriented to person, place, and time.  Consistent weakness below the C6 level. Gary Burch is sensory complete. Reflexes are 3+. continued clonus in both LE's.  Psychiatric: Gary Burch has a normal mood and affect. His behavior is normal. Judgment and thought content normal.     Assessment & Plan:   1. C6 spinal cord injury, complete.  2. Right lateral epicondylitis with ulnar nerve irritation  3. Right rotator cuff syndrome  4. Neurogenic bowel and bladder  5. Thrombocytopenia: likely due to lyrica (delayed response).  6. Bilateral LE DVT's   Plan:  1. Continue xarelto per Dr. Nyoka Cowden. Gary Burch will require life long anticoagulation. Discussed risk of bleeding complications while on this medication. Gary Burch is aware  2. Platelets 176kg in February 2016. Recommend follow up next month with Dr. Nyoka Cowden.  3. Probiotics, fiber for stool consistency.   4. Continue with bowel program as Gary Burch's doing. Imodium has helped with stool consistency 5. Bladder per urology. Having more difficulties with passing foley recently which may be related to his age and chronic I/O caths.   I'll see him back in about a year. 15 minutes of face to face patient care time were spent during this visit. All questions were encouraged and answered.

## 2017-03-19 NOTE — Patient Instructions (Signed)
CHECK A CBC NEXT WEEK WHEN YOU SEE DR. GREEN

## 2017-05-29 ENCOUNTER — Ambulatory Visit: Payer: Medicare Other | Admitting: Podiatry

## 2017-06-13 ENCOUNTER — Encounter: Payer: Self-pay | Admitting: Podiatry

## 2017-06-13 ENCOUNTER — Ambulatory Visit (INDEPENDENT_AMBULATORY_CARE_PROVIDER_SITE_OTHER): Payer: Medicare Other | Admitting: Podiatry

## 2017-06-13 DIAGNOSIS — B351 Tinea unguium: Secondary | ICD-10-CM | POA: Diagnosis not present
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
## 2017-09-17 ENCOUNTER — Ambulatory Visit (INDEPENDENT_AMBULATORY_CARE_PROVIDER_SITE_OTHER): Payer: Medicare Other | Admitting: Podiatry

## 2017-09-17 DIAGNOSIS — B351 Tinea unguium: Secondary | ICD-10-CM | POA: Diagnosis not present
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
## 2017-10-18 DIAGNOSIS — H60331 Swimmer's ear, right ear: Secondary | ICD-10-CM | POA: Insufficient documentation

## 2017-10-31 ENCOUNTER — Telehealth: Payer: Self-pay | Admitting: Gastroenterology

## 2017-10-31 NOTE — Telephone Encounter (Signed)
Left message on machine to call back  

## 2017-11-01 NOTE — Telephone Encounter (Signed)
Pt said he is returning your call 

## 2017-11-01 NOTE — Telephone Encounter (Signed)
The pt has had some BRB rectal bleeding and called PCP and was advised to go on full liquids and see if that helps.  He also has lower abd pain.  Also saw Urology today.  He did not think it was related to the in/out cath he does daily.  Has a new CNA that assist with bowel regimen of digital stimulation and removal of stool.  He states the CNA had long fingernails and is not sure if that has caused the blood.  Has only seen the blood on 3 occasions in the past 2 weeks. He watch for blood and pain over the weekend and call back with an update on Monday.

## 2017-11-13 ENCOUNTER — Encounter (HOSPITAL_COMMUNITY): Payer: Self-pay | Admitting: Emergency Medicine

## 2017-11-13 ENCOUNTER — Emergency Department (HOSPITAL_BASED_OUTPATIENT_CLINIC_OR_DEPARTMENT_OTHER): Payer: Medicare Other

## 2017-11-13 ENCOUNTER — Other Ambulatory Visit: Payer: Self-pay

## 2017-11-13 ENCOUNTER — Emergency Department (HOSPITAL_COMMUNITY)
Admission: EM | Admit: 2017-11-13 | Discharge: 2017-11-13 | Disposition: A | Discharge status: Home / Self Care | Payer: Medicare Other | Attending: Emergency Medicine | Admitting: Emergency Medicine

## 2017-11-13 DIAGNOSIS — R011 Cardiac murmur, unspecified: Secondary | ICD-10-CM | POA: Diagnosis not present

## 2017-11-13 DIAGNOSIS — G8254 Quadriplegia, C5-C7 incomplete: Secondary | ICD-10-CM | POA: Insufficient documentation

## 2017-11-13 DIAGNOSIS — M7989 Other specified soft tissue disorders: Secondary | ICD-10-CM

## 2017-11-13 DIAGNOSIS — R2242 Localized swelling, mass and lump, left lower limb: Secondary | ICD-10-CM | POA: Insufficient documentation

## 2017-11-13 DIAGNOSIS — Z79899 Other long term (current) drug therapy: Secondary | ICD-10-CM | POA: Diagnosis not present

## 2017-11-13 LAB — CBC WITH DIFFERENTIAL/PLATELET
BASOS ABS: 0 10*3/uL (ref 0.0–0.1)
Basophils Relative: 1 %
EOS ABS: 0.3 10*3/uL (ref 0.0–0.7)
Eosinophils Relative: 4 %
HCT: 40.2 % (ref 39.0–52.0)
HEMOGLOBIN: 13.4 g/dL (ref 13.0–17.0)
LYMPHS ABS: 1.4 10*3/uL (ref 0.7–4.0)
LYMPHS PCT: 18 %
MCH: 32 pg (ref 26.0–34.0)
MCHC: 33.3 g/dL (ref 30.0–36.0)
MCV: 95.9 fL (ref 78.0–100.0)
Monocytes Absolute: 0.8 10*3/uL (ref 0.1–1.0)
Monocytes Relative: 11 %
NEUTROS PCT: 66 %
Neutro Abs: 5 10*3/uL (ref 1.7–7.7)
PLATELETS: 185 10*3/uL (ref 150–400)
RBC: 4.19 MIL/uL — AB (ref 4.22–5.81)
RDW: 13.1 % (ref 11.5–15.5)
WBC: 7.5 10*3/uL (ref 4.0–10.5)

## 2017-11-13 LAB — COMPREHENSIVE METABOLIC PANEL
ALT: 62 U/L (ref 17–63)
AST: 33 U/L (ref 15–41)
Albumin: 3.3 g/dL — ABNORMAL LOW (ref 3.5–5.0)
Alkaline Phosphatase: 120 U/L (ref 38–126)
Anion gap: 10 (ref 5–15)
BILIRUBIN TOTAL: 0.7 mg/dL (ref 0.3–1.2)
BUN: 7 mg/dL (ref 6–20)
CHLORIDE: 103 mmol/L (ref 101–111)
CO2: 25 mmol/L (ref 22–32)
CREATININE: 0.74 mg/dL (ref 0.61–1.24)
Calcium: 8.9 mg/dL (ref 8.9–10.3)
Glucose, Bld: 98 mg/dL (ref 65–99)
POTASSIUM: 3.9 mmol/L (ref 3.5–5.1)
Sodium: 138 mmol/L (ref 135–145)
TOTAL PROTEIN: 6.8 g/dL (ref 6.5–8.1)

## 2017-11-13 LAB — I-STAT CG4 LACTIC ACID, ED: LACTIC ACID, VENOUS: 0.89 mmol/L (ref 0.5–1.9)

## 2017-11-13 MED ORDER — DOXYCYCLINE HYCLATE 100 MG PO CAPS: 100.0000 mg | ORAL_CAPSULE | Freq: Two times a day (BID) | ORAL | 0 refills | Status: DC
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
## 2017-11-13 NOTE — Progress Notes (Signed)
LLE venous duplex prelim: negative for DVT. Gayle Collard Eunice, RDMS, RVT  

## 2017-11-13 NOTE — Discharge Instructions (Signed)
Your ultrasound was negative for blood clots today.   Given your recent cut, redness, mild pain in the area we will cover your for cellulitis to prevent further worsening infection.   Take antibiotic as prescribed.   We recommend follow up with your primary care doctor in 2-3 days to ensure symptoms are improving and not worsening.   Return to ED if you develop worsening redness, pain, fevers, chills, despite antibiotics.

## 2017-11-13 NOTE — ED Triage Notes (Signed)
Pt to ER for further evaluation of lower left extremity swelling that has progressively gotten worse over the last week. States three weeks ago had a cut present that he was self-treating with neosporin, a rash appeared last week and was seen by a dermatologist Friday that advised it could be from the neosporin. Pt lower leg/ankle is swollen, red, and warm.

## 2017-12-03 ENCOUNTER — Encounter: Payer: Self-pay | Admitting: Podiatry

## 2017-12-03 ENCOUNTER — Ambulatory Visit (INDEPENDENT_AMBULATORY_CARE_PROVIDER_SITE_OTHER): Payer: Medicare Other | Admitting: Podiatry

## 2017-12-03 DIAGNOSIS — B351 Tinea unguium: Secondary | ICD-10-CM

## 2017-12-03 DIAGNOSIS — M79676 Pain in unspecified toe(s): Secondary | ICD-10-CM | POA: Diagnosis not present
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
## 2018-03-05 ENCOUNTER — Encounter: Payer: Self-pay | Admitting: Podiatry

## 2018-03-05 ENCOUNTER — Ambulatory Visit (INDEPENDENT_AMBULATORY_CARE_PROVIDER_SITE_OTHER): Payer: Medicare Other | Admitting: Podiatry

## 2018-03-05 DIAGNOSIS — M79676 Pain in unspecified toe(s): Secondary | ICD-10-CM | POA: Diagnosis not present

## 2018-03-05 DIAGNOSIS — B351 Tinea unguium: Secondary | ICD-10-CM
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
## 2018-03-19 ENCOUNTER — Encounter: Payer: Self-pay | Admitting: Physical Medicine & Rehabilitation

## 2018-03-19 ENCOUNTER — Encounter: Payer: Medicare Other | Attending: Physical Medicine & Rehabilitation | Admitting: Physical Medicine & Rehabilitation

## 2018-03-19 VITALS — BP 89/54 | HR 67 | Ht 72.0 in | Wt 165.0 lb

## 2018-03-19 DIAGNOSIS — N319 Neuromuscular dysfunction of bladder, unspecified: Secondary | ICD-10-CM | POA: Diagnosis not present

## 2018-03-19 DIAGNOSIS — Z7901 Long term (current) use of anticoagulants: Secondary | ICD-10-CM | POA: Diagnosis not present

## 2018-03-19 DIAGNOSIS — I82403 Acute embolism and thrombosis of unspecified deep veins of lower extremity, bilateral: Secondary | ICD-10-CM | POA: Insufficient documentation

## 2018-03-19 DIAGNOSIS — M7711 Lateral epicondylitis, right elbow: Secondary | ICD-10-CM | POA: Diagnosis not present

## 2018-03-19 DIAGNOSIS — M75101 Unspecified rotator cuff tear or rupture of right shoulder, not specified as traumatic: Secondary | ICD-10-CM | POA: Diagnosis not present

## 2018-03-19 DIAGNOSIS — G8250 Quadriplegia, unspecified: Secondary | ICD-10-CM

## 2018-03-19 DIAGNOSIS — S14106A Unspecified injury at C6 level of cervical spinal cord, initial encounter: Secondary | ICD-10-CM | POA: Insufficient documentation

## 2018-03-19 DIAGNOSIS — K592 Neurogenic bowel, not elsewhere classified: Secondary | ICD-10-CM

## 2018-03-19 DIAGNOSIS — D696 Thrombocytopenia, unspecified: Secondary | ICD-10-CM | POA: Insufficient documentation

## 2018-03-19 NOTE — Patient Instructions (Signed)
PLEASE FEEL FREE TO CALL OUR OFFICE WITH ANY PROBLEMS OR QUESTIONS ([phone])      

## 2018-03-19 NOTE — Progress Notes (Signed)
Subjective:    Patient ID: Gary Burch, male    DOB: [birth year], 57 y.o.   MRN: [id number]  HPI   Gary Burch is here in follow up of his C6 spinal cord injury. He was in the ED around Christmas when he developed swelling and redness in his legs. He was treated for a cellulitis and the symptoms seemed to have resolved. Dopplers were normal.   He was in to the see his urologist a month ago with concern of a UTI. His BP was quite low and he didn't feel well at the time. He was given abx and symptoms resolved. He has follow up this month.   His bowels are fairly regular. They are more formed. He still uses imodium on occasion if they are loose  Spasticity is controlled/at baseline. He reports no skin problems except fo rhis righ hand which has a tiny opening due to rubbing on his splint     Pain Inventory Average Pain 2 Pain Right Now 2 My pain is intermittent, sharp and tingling  In the last 24 hours, has pain interfered with the following? General activity 1 Relation with others 1 Enjoyment of life 2 What TIME of day is your pain at its worst? daytime Sleep (in general) Fair  Pain is worse with: sitting and some activites Pain improves with: rest and heat/ice Relief from Meds: 2  Mobility ability to climb steps?  no do you drive?  no use a wheelchair needs help with transfers  Function disabled: date disabled 1 I need assistance with the following:  dressing, bathing, toileting, meal prep, household duties and shopping  Neuro/Psych spasms  Prior Studies Any changes since last visit?  no  Physicians involved in your care Any changes since last visit?  no   Family History  Problem Relation Age of Onset  . Colon polyps Father   . Parkinson's disease Father   . Alzheimer's disease Father   . Hypertension Mother   . Heart disease Mother    Social History   Socioeconomic History  . Marital status: Single    Spouse name: Not on file  . Number of children:  Not on file  . Years of education: Not on file  . Highest education level: Not on file  Occupational History  . Not on file  Social Needs  . Financial resource strain: Not on file  . Food insecurity:    Worry: Not on file    Inability: Not on file  . Transportation needs:    Medical: Not on file    Non-medical: Not on file  Tobacco Use  . Smoking status: Never Smoker  . Smokeless tobacco: Never Used  Substance and Sexual Activity  . Alcohol use: Yes    Alcohol/week: 0.0 oz    Comment: occasional  . Drug use: No  . Sexual activity: Never  Lifestyle  . Physical activity:    Days per week: Not on file    Minutes per session: Not on file  . Stress: Not on file  Relationships  . Social connections:    Talks on phone: Not on file    Gets together: Not on file    Attends religious service: Not on file    Active member of club or organization: Not on file    Attends meetings of clubs or organizations: Not on file    Relationship status: Not on file  Other Topics Concern  . Not on file  Social History Narrative  .  Not on file   Past Surgical History:  Procedure Laterality Date  . COLONOSCOPY WITH PROPOFOL N/A 03/23/2015   Procedure: COLONOSCOPY WITH PROPOFOL;  Surgeon: Milus Banister, MD;  Location: [facility];  Service: Endoscopy;  Laterality: N/A;  . EYE SURGERY     bilateral cataract  . GAS/FLUID EXCHANGE Right 11/10/2015   Procedure: GAS/FLUID EXCHANGE- C3F8 10% used;  Surgeon: Hurman Horn, MD;  Location: [place];  Service: Ophthalmology;  Laterality: Right;  . PARS PLANA VITRECTOMY Right 11/10/2015   Procedure: PARS PLANA VITRECTOMY WITH 25 GAUGE with endolaser ;  Surgeon: Hurman Horn, MD;  Location: [place];  Service: Ophthalmology;  Laterality: Right;  . RETINAL DETACHMENT SURGERY  nov  2016  . [place]   fusion  . TONSILLECTOMY     Past Medical History:  Diagnosis Date  . Cancer ([place])    skin cancer, basal cell carcinoma above eye brow  .  Cataracts, bilateral   . Cellulitis 01-2013   left leg  . Elevated liver enzymes    " once, as a response to medication"  . Heart murmur   . Kidney stone   . Pneumonia   . Retinal tear    bilateral  . Spine injury   . UTI (lower urinary tract infection)    BP (!) 89/54   Pulse 67   Ht 6' (1.829 m)   Wt 165 lb (74.8 kg)   SpO2 98%   BMI 22.38 kg/m   Opioid Risk Score:   Fall Risk Score:  `1  Depression screen PHQ 2/9  No flowsheet data found.   Review of Systems  Constitutional: Negative.   HENT: Negative.   Eyes: Negative.   Respiratory: Negative.   Cardiovascular: Positive for leg swelling.  Gastrointestinal: Negative.   Endocrine: Negative.   Genitourinary: Negative.   Musculoskeletal: Positive for gait problem.  Skin: Negative.   Allergic/Immunologic: Negative.   Neurological: Positive for numbness.  Hematological: Bruises/bleeds easily.  Psychiatric/Behavioral: Negative.   All other systems reviewed and are negative.      Objective:   Physical Exam General: No acute distress HEENT: EOMI, oral membranes moist Cards: reg rate  Chest: normal effort Abdomen: Soft, NT, ND Skin: dry, intact. Pin point area of breakdown on left 2nd mcp Extremities: no edema Musculoskeletal: Neurological: He is alert and oriented to person, place, and time.  Consistent weakness below the C6 level. He is sensory complete. Reflexes are 3+.continued clonus in both LE's, left more than right Psychiatric: He has a normal mood and affect. His behavior is normal. Judgment and thought content normal.    Assessment & Plan:  1. C6 spinal cord injury, complete.  2. Right lateral epicondylitis with ulnar nerve irritation  3. Right rotator cuff syndrome  4. Neurogenic bowel and bladder  5. Thrombocytopenia: likely due to lyrica (delayed response).  6. Bilateral LE DVT's   Plan:  1. Continue xarelto per Dr. Nyoka Cowden.   life long anticoagulation.  2. Platelets 185k in December  2018.    3. Probiotics should continue.   4. Continue with bowel program as he's doing. Imodium PRN 5. Bladder per urology. Discussed fluid intake, management.   I'll see him back in about a year. 15 minutes of face to face patient care time were spent during this visit. All questions were encouraged and answered.

## 2018-06-04 ENCOUNTER — Ambulatory Visit (INDEPENDENT_AMBULATORY_CARE_PROVIDER_SITE_OTHER): Payer: Medicare Other | Admitting: Podiatry

## 2018-06-04 ENCOUNTER — Encounter: Payer: Self-pay | Admitting: Podiatry

## 2018-06-04 DIAGNOSIS — B351 Tinea unguium: Secondary | ICD-10-CM | POA: Diagnosis not present

## 2018-06-04 DIAGNOSIS — M79676 Pain in unspecified toe(s): Secondary | ICD-10-CM | POA: Diagnosis not present
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
## 2018-09-01 ENCOUNTER — Other Ambulatory Visit: Payer: Self-pay | Admitting: Podiatry

## 2018-09-01 ENCOUNTER — Ambulatory Visit (INDEPENDENT_AMBULATORY_CARE_PROVIDER_SITE_OTHER): Payer: Medicare Other | Admitting: Podiatry

## 2018-09-01 ENCOUNTER — Telehealth: Payer: Self-pay | Admitting: Podiatry

## 2018-09-01 DIAGNOSIS — R600 Localized edema: Secondary | ICD-10-CM

## 2018-09-01 DIAGNOSIS — M2042 Other hammer toe(s) (acquired), left foot: Secondary | ICD-10-CM

## 2018-09-01 DIAGNOSIS — G6289 Other specified polyneuropathies: Secondary | ICD-10-CM | POA: Diagnosis not present

## 2018-09-01 DIAGNOSIS — B353 Tinea pedis: Secondary | ICD-10-CM

## 2018-09-01 DIAGNOSIS — M2041 Other hammer toe(s) (acquired), right foot: Secondary | ICD-10-CM

## 2018-09-01 DIAGNOSIS — B351 Tinea unguium: Secondary | ICD-10-CM

## 2018-09-01 MED ORDER — KETOCONAZOLE 2 % EX GEL: CUTANEOUS | 0 refills | Status: DC

## 2018-09-01 NOTE — Telephone Encounter (Signed)
Can we resend and take care of the prior auth?

## 2018-09-01 NOTE — Addendum Note (Signed)
Addended by: Hardie Pulley on: 09/01/2018 02:18 PM   Modules accepted: Orders, Level of Service

## 2018-09-01 NOTE — Progress Notes (Addendum)
Subjective:  Patient ID: Gary Burch, male    DOB: 08/23/1961,  MRN: [id number]  Chief Complaint  Patient presents with  . nail care    BL nail trimming   . Skin Problem    R 1st and 2nd interspace skin irritation x 2 days; very itchy Tx: tinactin    57 y.o. male presents with the above complaint. Above history confirmed with patient.   Review of Systems: Negative except as noted in the HPI. Denies N/V/F/Ch.  Past Medical History:  Diagnosis Date  . Cancer ([place])    skin cancer, basal cell carcinoma above eye brow  . Cataracts, bilateral   . Cellulitis 01-2013   left leg  . Elevated liver enzymes    " once, as a response to medication"  . Heart murmur   . Kidney stone   . Pneumonia   . Retinal tear    bilateral  . Spine injury   . UTI (lower urinary tract infection)     Current Outpatient Medications:  .  ampicillin (PRINCIPEN) 500 MG capsule, Take 500 mg by mouth daily., Disp: , Rfl:  .  baclofen (LIORESAL) 10 MG tablet, Take 10 mg by mouth 4 (four) times daily., Disp: , Rfl:  .  bethanechol (URECHOLINE) 25 MG tablet, Take 50 mg by mouth 3 (three) times daily., Disp: , Rfl:  .  Cetirizine-Pseudoephedrine (ZYRTEC-D PO), Take 1 tablet by mouth at bedtime. , Disp: , Rfl:  .  clindamycin (CLEOCIN T) 1 % lotion, APPLY 1 APPLICATION ON THE SKIN IN THE MORNING, Disp: , Rfl: 2 .  clindamycin-benzoyl peroxide (BENZACLIN) gel, Apply 1 application topically daily as needed (for acne). , Disp: , Rfl:  .  Cranberry 450 MG TABS, Take 1 tablet by mouth daily., Disp: , Rfl:  .  diazepam (VALIUM) 2 MG tablet, Take 2 mg by mouth 2 (two) times daily., Disp: , Rfl:  .  doxazosin (CARDURA) 4 MG tablet, TAKE 1 1/2 TABLETS BY MOUTH AT BEDTIME (Patient taking differently: Take 6 mg by mouth at bedtime. ), Disp: 45 tablet, Rfl: 6 .  Loperamide HCl (IMODIUM PO), Take by mouth daily., Disp: , Rfl:  .  Multiple Vitamin (MULTIVITAMIN WITH MINERALS) TABS tablet, Take 1 tablet by mouth daily.,  Disp: , Rfl:  .  mupirocin ointment (BACTROBAN) 2 %, Apply 1 application topically daily as needed (for skin). , Disp: , Rfl: 2 .  pregabalin (LYRICA) 150 MG capsule, Take 150 mg by mouth 2 (two) times daily., Disp: , Rfl:  .  Probiotic Product (PROBIOTIC DAILY) CAPS, Take 1 capsule by mouth daily., Disp: , Rfl:  .  SF 5000 PLUS 1.1 % CREA dental cream, Place 1 application onto teeth at bedtime. , Disp: , Rfl: 4 .  tretinoin (RETIN-A) 0.01 % gel, Apply topically at bedtime., Disp: , Rfl:  .  XARELTO 15 MG TABS tablet, , Disp: , Rfl:  .  Ketoconazole 2 % GEL, Apply fingertip amount in between right 1st and second toes once daily., Disp: 45 g, Rfl: 0  Social History   Tobacco Use  Smoking Status Never Smoker  Smokeless Tobacco Never Used    Allergies  Allergen Reactions  . Macrodantin Rash  . Sulfa Antibiotics Rash   Objective:  There were no vitals filed for this visit. There is no height or weight on file to calculate BMI. Constitutional Well developed. Well nourished.  Vascular Dorsalis pedis pulses palpable bilaterally. Posterior tibial pulses palpable bilaterally. Capillary refill normal to  all digits.  No cyanosis or clubbing noted. Pedal hair growth normal.  Neurologic Normal speech. Oriented to person, place, and time. Epicritic sensation to light touch grossly present bilaterally.  Dermatologic Nails well groomed and normal in appearance. No open wounds. Interdigital fissure and xerosis right 1st interspace  Orthopedic: Quadraplegia. Hammertoe deformities bilat No bony tenderness.   Radiographs: none Assessment:   1. Onychomycosis   2. Other polyneuropathy   3. Tinea pedis of both feet   4. Hammer toes of both feet   5. Localized edema    Plan:  Patient was evaluated and treated and all questions answered.  Onychomycosis with Neuropathy/Quadraplegia -Debrided as below.  Procedure: Nail Debridement Rationale: Patient meets criteria for routine foot care  due to neuropathy Type of Debridement: manual, sharp debridement. Instrumentation: Nail nipper, rotary burr. Number of Nails: 10  Tinea, Interdigital -Rx Keto gel  Hammertoes -Educated on etiology.  Peripheral edema -Rx Compression socks zippered with open toes 2/2 hammetoes.     Return in about 3 months (around 12/02/2018) for Routine Foot Care.

## 2018-09-01 NOTE — Telephone Encounter (Signed)
I was there earlier and saw Dr. March Rummage. He prescribed a medicine to [facility] but it was sent to the wrong one. I use the one at [address] and this once was called into [employer] and [employer]. I was wondering if it could be sent to my pharmacy and if it does require a prior authorization. My number is [phone] if you need to speak to me. Thanks.

## 2018-09-03 ENCOUNTER — Ambulatory Visit: Payer: Medicare Other | Admitting: Podiatry

## 2018-12-02 ENCOUNTER — Encounter: Payer: Self-pay | Admitting: Podiatry

## 2018-12-02 ENCOUNTER — Ambulatory Visit (INDEPENDENT_AMBULATORY_CARE_PROVIDER_SITE_OTHER): Payer: Medicare Other | Admitting: Podiatry

## 2018-12-02 DIAGNOSIS — B351 Tinea unguium: Secondary | ICD-10-CM

## 2018-12-02 DIAGNOSIS — G6289 Other specified polyneuropathies: Secondary | ICD-10-CM

## 2018-12-02 NOTE — Progress Notes (Signed)
This patient presents the office with chief complaint of long  painful nails.  He states that his nails are painful wearing his foot gear.  He is unable to self treat since he is quadriplegic.  He says he has especially painful problems on his fifth toenails both feet.  He also says he is developing a red inflamed area on the back of his right heel.  He presents the office today for preventative foot care services.  General Appearance  Alert, conversant and in no acute stress.  Vascular  Dorsalis pedis and posterior tibial  pulses are palpable  bilaterally.  Capillary return is within normal limits  bilaterally. Temperature is within normal limits  bilaterally.  Neurologic  Senn-Weinstein monofilament wire test absent  bilaterally. Muscle power negative.  Nails Thick disfigured discolored nails with subungual debris  from hallux to fifth toes bilaterally. No evidence of bacterial infection or drainage bilaterally.  Orthopedic  No limitations of motion  feet .  No crepitus or effusions noted.  No bony pathology or digital deformities noted.  Skin  normotropic skin with no porokeratosis noted bilaterally.  No signs of infections or ulcers noted.    Onychomycosis  B/l  ROV  Debridement of nails  X 10.  Told this patient his right heel has no evidence of any redness swelling warmth infection.  No evidence of any skin breaks noted.  From his description it sounds like this is a pressure phenomenon and he his having symptoms of a precursor to a pressure ulcer.  Told him to use pillows and additional padding to his right heel to prevent breakdown of the skin.  RTC prn   Gardiner Barefoot DPM

## 2018-12-03 ENCOUNTER — Ambulatory Visit: Payer: Medicare Other | Admitting: Podiatry

## 2019-03-03 ENCOUNTER — Ambulatory Visit: Payer: Medicare Other | Admitting: Podiatry

## 2019-03-18 ENCOUNTER — Ambulatory Visit: Payer: Self-pay | Admitting: Physical Medicine & Rehabilitation

## 2019-03-31 ENCOUNTER — Ambulatory Visit: Payer: Self-pay | Admitting: Physical Medicine & Rehabilitation

## 2019-04-08 ENCOUNTER — Ambulatory Visit: Payer: Medicare Other | Admitting: Podiatry

## 2019-04-15 ENCOUNTER — Other Ambulatory Visit: Payer: Self-pay

## 2019-04-15 ENCOUNTER — Encounter: Payer: Self-pay | Admitting: Podiatry

## 2019-04-15 ENCOUNTER — Ambulatory Visit (INDEPENDENT_AMBULATORY_CARE_PROVIDER_SITE_OTHER): Payer: Medicare Other | Admitting: Podiatry

## 2019-04-15 VITALS — Temp 97.9°F

## 2019-04-15 DIAGNOSIS — B351 Tinea unguium: Secondary | ICD-10-CM | POA: Diagnosis not present

## 2019-04-15 DIAGNOSIS — M79676 Pain in unspecified toe(s): Secondary | ICD-10-CM | POA: Diagnosis not present

## 2019-04-15 NOTE — Progress Notes (Signed)
This patient presents the office with chief complaint of long  painful nails.  He states that his nails are painful wearing his foot gear.  He is unable to self treat since he is quadriplegic.  He says he has especially painful problems on his fifth toenails both feet.  He says he self avulsed his fourth toenail right foot days ago.  No pain or drainage today. He presents the office today for preventative foot care services.  General Appearance  Alert, conversant and in no acute stress.  Vascular  Dorsalis pedis and posterior tibial  pulses are palpable  bilaterally.  Capillary return is within normal limits  bilaterally. Temperature is within normal limits  bilaterally.  Neurologic  Senn-Weinstein monofilament wire test absent  bilaterally. Muscle power negative.  Nails Thick disfigured discolored nails with subungual debris  from hallux to fifth toes bilaterally. No evidence of bacterial infection or drainage bilaterally. Fourth toenail right foot has self avulsed.  Orthopedic  No limitations of motion  feet .  No crepitus or effusions noted.  No bony pathology or digital deformities noted.  Skin  normotropic skin with no porokeratosis noted bilaterally.  No signs of infections or ulcers noted.    Onychomycosis  B/l  ROV  Debridement of nails  X 9.  Marland Kitchen  RTC 3 months   Gardiner Barefoot DPM

## 2019-04-17 ENCOUNTER — Encounter

## 2019-04-17 ENCOUNTER — Ambulatory Visit: Payer: Medicare Other | Admitting: Podiatry

## 2019-04-22 ENCOUNTER — Ambulatory Visit: Payer: Medicare Other | Admitting: Podiatry

## 2019-05-12 ENCOUNTER — Ambulatory Visit: Payer: Medicare Other | Admitting: Physical Medicine & Rehabilitation

## 2019-06-23 ENCOUNTER — Ambulatory Visit: Payer: Medicare Other | Admitting: Podiatry

## 2019-07-08 ENCOUNTER — Encounter: Payer: Medicare Other | Attending: Physical Medicine & Rehabilitation | Admitting: Physical Medicine & Rehabilitation

## 2019-07-08 ENCOUNTER — Encounter: Payer: Self-pay | Admitting: Physical Medicine & Rehabilitation

## 2019-07-08 ENCOUNTER — Other Ambulatory Visit: Payer: Self-pay

## 2019-07-08 VITALS — BP 97/55 | HR 56 | Temp 98.7°F

## 2019-07-08 DIAGNOSIS — N319 Neuromuscular dysfunction of bladder, unspecified: Secondary | ICD-10-CM | POA: Insufficient documentation

## 2019-07-08 DIAGNOSIS — S14106S Unspecified injury at C6 level of cervical spinal cord, sequela: Secondary | ICD-10-CM | POA: Diagnosis present

## 2019-07-08 DIAGNOSIS — K592 Neurogenic bowel, not elsewhere classified: Secondary | ICD-10-CM | POA: Diagnosis present

## 2019-07-08 NOTE — Patient Instructions (Signed)
TRY TO START IMPLEMENTING A SUPPOSITORY BACK INTO YOUR BOWEL PROGRAM EVEN IF IT'S JUST AS NEEDED

## 2019-07-08 NOTE — Progress Notes (Signed)
Subjective:    Patient ID: Gary Burch, male    DOB: Feb 06, 1961, 58 y.o.   MRN: [id number]  HPI   Mr. Oatley is here in follow up of is C6 spinal cord injury. For the most part he has been doing well. His ulnar nerves are acting up again. He noticed the left side most recently. It often bothers him after sleeping.   His spasticity is under controll. His bowel program is fairly regular but he is really having bowel manually removed for the most part. More recently it's been dig stim which has not always emptying.   Bladder is followed by urology. He has regular UTI's with colonization  Skin has been intact.  Pain Inventory Average Pain 2 Pain Right Now 3 My pain is intermittent, sharp and aching  In the last 24 hours, has pain interfered with the following? General activity 3 Relation with others 3 Enjoyment of life 3 What TIME of day is your pain at its worst? all Sleep (in general) Fair  Pain is worse with: some activites Pain improves with: rest and heat/ice Relief from Meds: 2  Mobility ability to climb steps?  no do you drive?  no use a wheelchair needs help with transfers  Function disabled: date disabled 70 I need assistance with the following:  dressing, bathing, toileting, meal prep, household duties and shopping  Neuro/Psych tingling spasms  Prior Studies Any changes since last visit?  no  Physicians involved in your care Urology, Gertie Fey, Dermatology   Family History  Problem Relation Age of Onset  . Colon polyps Father   . Parkinson's disease Father   . Alzheimer's disease Father   . Hypertension Mother   . Heart disease Mother    Social History   Socioeconomic History  . Marital status: Single    Spouse name: Not on file  . Number of children: Not on file  . Years of education: Not on file  . Highest education level: Not on file  Occupational History  . Not on file  Social Needs  . Financial resource strain: Not on file  . Food  insecurity    Worry: Not on file    Inability: Not on file  . Transportation needs    Medical: Not on file    Non-medical: Not on file  Tobacco Use  . Smoking status: Never Smoker  . Smokeless tobacco: Never Used  Substance and Sexual Activity  . Alcohol use: Yes    Alcohol/week: 0.0 standard drinks    Comment: occasional  . Drug use: No  . Sexual activity: Never  Lifestyle  . Physical activity    Days per week: Not on file    Minutes per session: Not on file  . Stress: Not on file  Relationships  . Social Herbalist on phone: Not on file    Gets together: Not on file    Attends religious service: Not on file    Active member of club or organization: Not on file    Attends meetings of clubs or organizations: Not on file    Relationship status: Not on file  Other Topics Concern  . Not on file  Social History Narrative  . Not on file   Past Surgical History:  Procedure Laterality Date  . COLONOSCOPY WITH PROPOFOL N/A 03/23/2015   Procedure: COLONOSCOPY WITH PROPOFOL;  Surgeon: Milus Banister, MD;  Location: [facility];  Service: Endoscopy;  Laterality: N/A;  . EYE SURGERY  bilateral cataract  . GAS/FLUID EXCHANGE Right 11/10/2015   Procedure: GAS/FLUID EXCHANGE- C3F8 10% used;  Surgeon: Hurman Horn, MD;  Location: [place];  Service: Ophthalmology;  Laterality: Right;  . PARS PLANA VITRECTOMY Right 11/10/2015   Procedure: PARS PLANA VITRECTOMY WITH 25 GAUGE with endolaser ;  Surgeon: Hurman Horn, MD;  Location: [place];  Service: Ophthalmology;  Laterality: Right;  . RETINAL DETACHMENT SURGERY  nov  2016  . [place]   fusion  . TONSILLECTOMY     Past Medical History:  Diagnosis Date  . Cancer ([place])    skin cancer, basal cell carcinoma above eye brow  . Cataracts, bilateral   . Cellulitis 01-2013   left leg  . Elevated liver enzymes    " once, as a response to medication"  . Heart murmur   . Kidney stone   . Pneumonia   . Retinal tear     bilateral  . Spine injury   . UTI (lower urinary tract infection)    BP (!) 97/55   Pulse (!) 56   Temp 98.7 F (37.1 C)   SpO2 98%   Opioid Risk Score:   Fall Risk Score:  `1  Depression screen PHQ 2/9  Depression screen PHQ 2/9 07/08/2019  Decreased Interest 0  Down, Depressed, Hopeless 0  PHQ - 2 Score 0   Review of Systems  Constitutional: Negative.   HENT: Negative.   Eyes: Negative.   Respiratory: Negative.   Cardiovascular: Negative.   Gastrointestinal: Negative.   Endocrine: Negative.   Genitourinary: Negative.   Musculoskeletal: Negative.   Skin: Positive for rash.  Allergic/Immunologic: Negative.   Neurological: Negative.   Hematological: Negative.   Psychiatric/Behavioral: Negative.   All other systems reviewed and are negative.      Objective:   Physical Exam General: No acute distress HEENT: EOMI, oral membranes moist Cards: reg rate  Chest: normal effort Abdomen: Soft, NT, ND Skin: dry, intact Extremities: no edema  Musculoskeletal: elbow ROM normal Neurological: He is alert and oriented to person, place, and time. Consistentweakness below the C6 level. DTR's 3+ continued clonus in both LE's,STABLE Psychiatric:pleasant.    Assessment & Plan:  1. C6 spinal cord injury, complete.  2. Right lateral epicondylitis with ulnar nerve irritation, involving left too 3. Right rotator cuff syndrome  4. Neurogenic bowel and bladder  5. Thrombocytopenia: likely due to lyrica (delayed response). 6. Bilateral LE DVT's  Plan:  1.Continue xarelto for  life long anticoagulation.  2. Platelets 185k in December 2018.   3. Probiotics should continue.   4. Continue with bowel program. Discussed use of suppository as part of his am program 5.Bladder per urology. Discussed fluid intake, management as he's doing 6. Padding and extension of elbows  Fifteen minutes of face to face patient care time were spent during this visit. All questions  were encouraged and answered.  Follow up with me in 1 year .

## 2019-07-22 ENCOUNTER — Encounter: Payer: Self-pay | Admitting: Podiatry

## 2019-07-22 ENCOUNTER — Other Ambulatory Visit: Payer: Self-pay

## 2019-07-22 ENCOUNTER — Ambulatory Visit (INDEPENDENT_AMBULATORY_CARE_PROVIDER_SITE_OTHER): Payer: Medicare Other | Admitting: Podiatry

## 2019-07-22 DIAGNOSIS — G6289 Other specified polyneuropathies: Secondary | ICD-10-CM

## 2019-07-22 DIAGNOSIS — R600 Localized edema: Secondary | ICD-10-CM

## 2019-07-22 DIAGNOSIS — B351 Tinea unguium: Secondary | ICD-10-CM | POA: Diagnosis not present

## 2019-07-22 NOTE — Progress Notes (Addendum)
This patient presents the office with chief complaint of long  painful nails.  He states that his nails are painful wearing his foot gear.  He is unable to self treat since he is quadriplegic.  He says he has especially painful problems on his fifth toenails both feet.  No pain or drainage today. He presents the office today for preventative foot care services.  General Appearance  Alert, conversant and in no acute stress.  Vascular  Dorsalis pedis and posterior tibial  pulses are not  palpable  Bilaterally due to swelling.  Capillary return is within normal limits  bilaterally. Temperature is within normal limits  bilaterally.  Neurologic  Senn-Weinstein monofilament wire test absent  bilaterally. Muscle power negative.  Nails Thick disfigured discolored nails with subungual debris  from hallux to fifth toes bilaterally. No evidence of bacterial infection or drainage bilaterally. Fourth toenail right foot has self avulsed.  Orthopedic  No limitations of motion  feet .  No crepitus or effusions noted.  No bony pathology or digital deformities noted.  Skin  normotropic skin with no porokeratosis noted bilaterally.  No signs of infections or ulcers noted.    Onychomycosis  B/l  ROV  Debridement of nails    .  RTC 9 weeks    Gardiner Barefoot DPM

## 2019-09-29 ENCOUNTER — Ambulatory Visit (INDEPENDENT_AMBULATORY_CARE_PROVIDER_SITE_OTHER): Payer: Medicare Other | Admitting: Podiatry

## 2019-09-29 ENCOUNTER — Other Ambulatory Visit: Payer: Self-pay

## 2019-09-29 ENCOUNTER — Encounter: Payer: Self-pay | Admitting: Podiatry

## 2019-09-29 DIAGNOSIS — G6289 Other specified polyneuropathies: Secondary | ICD-10-CM

## 2019-09-29 DIAGNOSIS — B351 Tinea unguium: Secondary | ICD-10-CM

## 2019-09-29 NOTE — Progress Notes (Signed)
This patient presents the office with chief complaint of long  painful nails.  He states that his nails are painful wearing his foot gear.  He is unable to self treat since he is quadriplegic.  He says he has especially painful problems on his fifth toenails both feet.  No pain or drainage today. He presents the office today for preventative foot care services.  General Appearance  Alert, conversant and in no acute stress.  Vascular  Dorsalis pedis and posterior tibial  pulses are not  palpable  Bilaterally due to swelling.  Capillary return is within normal limits  bilaterally. Temperature is within normal limits  bilaterally.  Neurologic  Senn-Weinstein monofilament wire test absent  bilaterally. Muscle power negative.  Nails Thick disfigured discolored nails with subungual debris  from hallux to fifth toes bilaterally. No evidence of bacterial infection or drainage bilaterally. Fourth toenail right foot has self avulsed.  Orthopedic  No limitations of motion  feet .  No crepitus or effusions noted.  No bony pathology or digital deformities noted.  Skin  normotropic skin with no porokeratosis noted bilaterally.  No signs of infections or ulcers noted.    Onychomycosis  B/l  ROV  Debridement of nails    .  RTC 9 weeks    Gardiner Barefoot DPM

## 2019-09-30 ENCOUNTER — Ambulatory Visit: Payer: Medicare Other | Admitting: Podiatry

## 2019-11-25 DIAGNOSIS — Z86718 Personal history of other venous thrombosis and embolism: Secondary | ICD-10-CM | POA: Insufficient documentation

## 2019-11-25 DIAGNOSIS — R748 Abnormal levels of other serum enzymes: Secondary | ICD-10-CM | POA: Insufficient documentation

## 2019-11-25 DIAGNOSIS — R142 Eructation: Secondary | ICD-10-CM | POA: Insufficient documentation

## 2019-11-25 DIAGNOSIS — Z87442 Personal history of urinary calculi: Secondary | ICD-10-CM | POA: Insufficient documentation

## 2019-11-25 DIAGNOSIS — Z85828 Personal history of other malignant neoplasm of skin: Secondary | ICD-10-CM | POA: Insufficient documentation

## 2019-12-02 ENCOUNTER — Ambulatory Visit (INDEPENDENT_AMBULATORY_CARE_PROVIDER_SITE_OTHER): Payer: Medicare Other | Admitting: Podiatry

## 2019-12-02 ENCOUNTER — Other Ambulatory Visit: Payer: Self-pay

## 2019-12-02 ENCOUNTER — Encounter: Payer: Self-pay | Admitting: Podiatry

## 2019-12-02 DIAGNOSIS — M79676 Pain in unspecified toe(s): Secondary | ICD-10-CM | POA: Diagnosis not present

## 2019-12-02 DIAGNOSIS — B351 Tinea unguium: Secondary | ICD-10-CM

## 2019-12-02 NOTE — Progress Notes (Signed)
This patient presents the office with chief complaint of long  painful nails.  He states that his nails are painful wearing his foot gear.  He is unable to self treat since he is quadriplegic.  He says he has especially painful problems on his fifth toenails both feet.  No pain or drainage today. He presents the office today for preventative foot care services.  General Appearance  Alert, conversant and in no acute stress.  Vascular  Dorsalis pedis and posterior tibial  pulses are not  palpable  Bilaterally due to swelling.  Capillary return is within normal limits  bilaterally. Temperature is within normal limits  bilaterally.  Neurologic  Senn-Weinstein monofilament wire test absent  bilaterally. Muscle power negative.  Nails Thick disfigured discolored nails with subungual debris  from hallux to fifth toes bilaterally. No evidence of bacterial infection or drainage bilaterally.   Orthopedic  No limitations of motion  feet .  No crepitus or effusions noted.  No bony pathology or digital deformities noted.  Skin  normotropic skin with no porokeratosis noted bilaterally.  No signs of infections or ulcers noted.    Onychomycosis  B/l  ROV  Debridement of nails    .  RTC 9 weeks    Gardiner Barefoot DPM

## 2020-01-20 DIAGNOSIS — H61119 Acquired deformity of pinna, unspecified ear: Secondary | ICD-10-CM | POA: Insufficient documentation

## 2020-02-15 ENCOUNTER — Ambulatory Visit: Payer: Medicare Other | Admitting: Podiatry

## 2020-02-17 ENCOUNTER — Other Ambulatory Visit: Payer: Self-pay

## 2020-02-17 ENCOUNTER — Ambulatory Visit (INDEPENDENT_AMBULATORY_CARE_PROVIDER_SITE_OTHER): Payer: Medicare Other | Admitting: Podiatry

## 2020-02-17 ENCOUNTER — Encounter: Payer: Self-pay | Admitting: Podiatry

## 2020-02-17 VITALS — Temp 97.1°F

## 2020-02-17 DIAGNOSIS — B351 Tinea unguium: Secondary | ICD-10-CM

## 2020-02-17 DIAGNOSIS — R600 Localized edema: Secondary | ICD-10-CM | POA: Diagnosis not present

## 2020-02-17 NOTE — Progress Notes (Signed)
.  This patient returns to the office for evaluation and treatment of long thick painful nails .  This patient is unable to trim his own nails since the patient cannot reach the feet.  Patient says the nails are painful wearing his shoes. This patient presents to the office for nail care since he is a quadriplegia and is in a wheelchair.    He returns for preventive foot care services.  General Appearance  Alert, conversant and in no acute stress.  Vascular  Dorsalis pedis and posterior tibial  pulses are not  palpable  Bilaterally due to swelling..  Capillary return is within normal limits  bilaterally. Temperature is within normal limits  bilaterally.  Neurologic  Senn-Weinstein monofilament wire test absent  bilaterally. Muscle power within normal limits bilaterally.  Nails Thick disfigured discolored nails with subungual debris  from hallux to fifth toes bilaterally. No evidence of bacterial infection or drainage bilaterally.  Orthopedic  No limitations of motion  feet .  No crepitus or effusions noted.  No bony pathology or digital deformities noted.  Skin  normotropic skin with no porokeratosis noted bilaterally.  No signs of infections or ulcers noted.     Onychomycosis  Pain in toes right foot  Pain in toes left foot  Debridement  of nails  1-5  B/L with a nail nipper.  Nails were then filed using a dremel tool with no incidents.    RTC 9 weeks    Gardiner Barefoot DPM

## 2020-03-22 ENCOUNTER — Ambulatory Visit (INDEPENDENT_AMBULATORY_CARE_PROVIDER_SITE_OTHER): Payer: Medicare Other | Admitting: Gastroenterology

## 2020-03-22 ENCOUNTER — Encounter: Payer: Self-pay | Admitting: Gastroenterology

## 2020-03-22 VITALS — BP 120/74 | HR 63 | Temp 98.0°F

## 2020-03-22 DIAGNOSIS — K625 Hemorrhage of anus and rectum: Secondary | ICD-10-CM | POA: Diagnosis not present

## 2020-03-22 NOTE — Patient Instructions (Signed)
Take Citrucel every day for one month, then switch back to every other month  Please all back in 2 months with an update

## 2020-03-22 NOTE — Progress Notes (Signed)
Review of pertinent gastrointestinal problems: 1. Routine risk for colon cancer: normal colonsocopy 03/2016 (done for cologuard + stool testing) 2.  Change in bowel habits, looser than usual stools September 2017.  GI pathogen panel was negative.  I recommend a single Imodium once daily.   HPI: This is a very pleasant 59 year old man whom I last saw the time of office visit September 2017.  He intermittently has minor rectal bleeding.  Recently it has been a bit more often and in fact he had 10 to 12 days in a row of frank red blood per rectum.  He is a quadriplegic.  His bowel regimen is daily, digital manipulation by staff sometimes resulting in stool output sometimes not.  He is on Xarelto chronically for history of deep vein thrombosis in his legs.  No PE.  He will be on it lifelong   Review of systems: Pertinent positive and negative review of systems were noted in the above HPI section. All other review negative.   Past Medical History:  Diagnosis Date  . Cancer ([place])    skin cancer, basal cell carcinoma above eye brow  . Cataracts, bilateral   . Cellulitis 01-2013   left leg  . DVT (deep venous thrombosis) ([place])   . Elevated liver enzymes    " once, as a response to medication"  . Heart murmur   . Kidney stone   . Pneumonia   . Retinal tear    bilateral  . Spine injury   . UTI (lower urinary tract infection)     Past Surgical History:  Procedure Laterality Date  . COLONOSCOPY WITH PROPOFOL N/A 03/23/2015   Procedure: COLONOSCOPY WITH PROPOFOL;  Surgeon: Milus Banister, MD;  Location: [facility];  Service: Endoscopy;  Laterality: N/A;  . EYE SURGERY     bilateral cataract  . GAS/FLUID EXCHANGE Right 11/10/2015   Procedure: GAS/FLUID EXCHANGE- C3F8 10% used;  Surgeon: Hurman Horn, MD;  Location: [facility];  Service: Ophthalmology;  Laterality: Right;  . PARS PLANA VITRECTOMY Right 11/10/2015   Procedure: PARS PLANA VITRECTOMY WITH 25 GAUGE with endolaser ;  Surgeon:  Hurman Horn, MD;  Location: [facility];  Service: Ophthalmology;  Laterality: Right;  . RETINAL DETACHMENT SURGERY  nov  2016  . [place]   fusion  . TONSILLECTOMY      Current Outpatient Medications  Medication Sig Dispense Refill  . ampicillin (PRINCIPEN) 500 MG capsule Take 500 mg by mouth daily.    . baclofen (LIORESAL) 10 MG tablet Take 10 mg by mouth 4 (four) times daily.    . bethanechol (URECHOLINE) 25 MG tablet Take 50 mg by mouth 3 (three) times daily.    . Cetirizine-Pseudoephedrine (ZYRTEC-D PO) Take 1 tablet by mouth at bedtime.     . clindamycin (CLEOCIN T) 1 % lotion APPLY 1 APPLICATION ON THE SKIN IN THE MORNING  2  . clindamycin-benzoyl peroxide (BENZACLIN) gel Apply 1 application topically daily as needed (for acne).     . clotrimazole-betamethasone (LOTRISONE) cream APPLY SMALL AMOUNT TO AFFECTED AREA TWICE A DAY FOR 1 2 WEEKS    . Cranberry 450 MG TABS Take 1 tablet by mouth daily.    . diazepam (VALIUM) 2 MG tablet Take 2 mg by mouth 2 (two) times daily.    Marland Kitchen doxazosin (CARDURA) 4 MG tablet Take 4 mg by mouth daily.    Marland Kitchen ketoconazole (NIZORAL) 2 % cream Please specify directions, refills and quantity 1 g 0  .  Loperamide HCl (IMODIUM PO) Take by mouth daily.    . Multiple Vitamin (MULTIVITAMIN WITH MINERALS) TABS tablet Take 1 tablet by mouth daily.    . mupirocin ointment (BACTROBAN) 2 % Apply 1 application topically daily as needed (for skin).   2  . pregabalin (LYRICA) 150 MG capsule Take 150 mg by mouth 2 (two) times daily.    . pregabalin (LYRICA) 75 MG capsule     . Probiotic Product (PROBIOTIC DAILY) CAPS Take 1 capsule by mouth daily.    . SF 5000 PLUS 1.1 % CREA dental cream Place 1 application onto teeth at bedtime.   4  . tretinoin (RETIN-A) 0.05 % cream APPLY TO AFFECTED AREA EVERY NIGHT    . triamcinolone ointment (KENALOG) 0.1 % APPLY A SMALL DAB TO FINGER AND PLACE IN EXTERNAL EAR CANALS FOR ITCHING, TWICE DAILY PRN.    Marland Kitchen UNABLE TO FIND Take by  mouth.    . Vitamins/Minerals TABS Take by mouth.    Alveda Reasons 15 MG TABS tablet      No current facility-administered medications for this visit.    Allergies as of 03/22/2020 - Review Complete 03/22/2020  Allergen Reaction Noted  . Macrodantin Rash 01/30/2012  . Sulfa antibiotics Rash 01/30/2012    Family History  Problem Relation Age of Onset  . Colon polyps Father   . Parkinson's disease Father   . Alzheimer's disease Father   . Hypertension Mother   . Heart disease Mother     Social History   Socioeconomic History  . Marital status: Single    Spouse name: Not on file  . Number of children: Not on file  . Years of education: Not on file  . Highest education level: Not on file  Occupational History  . Not on file  Tobacco Use  . Smoking status: Never Smoker  . Smokeless tobacco: Never Used  Substance and Sexual Activity  . Alcohol use: Yes    Alcohol/week: 0.0 standard drinks    Comment: occasional  . Drug use: No  . Sexual activity: Never  Other Topics Concern  . Not on file  Social History Narrative  . Not on file   Social Determinants of Health   Financial Resource Strain:   . Difficulty of Paying Living Expenses:   Food Insecurity:   . Worried About Charity fundraiser in the Last Year:   . Arboriculturist in the Last Year:   Transportation Needs:   . Film/video editor (Medical):   Marland Kitchen Lack of Transportation (Non-Medical):   Physical Activity:   . Days of Exercise per Week:   . Minutes of Exercise per Session:   Stress:   . Feeling of Stress :   Social Connections:   . Frequency of Communication with Friends and Family:   . Frequency of Social Gatherings with Friends and Family:   . Attends Religious Services:   . Active Member of Clubs or Organizations:   . Attends Archivist Meetings:   Marland Kitchen Marital Status:   Intimate Partner Violence:   . Fear of Current or Ex-Partner:   . Emotionally Abused:   Marland Kitchen Physically Abused:   .  Sexually Abused:      Physical Exam: Temp 100 F (36.7 C)  Constitutional: generally well-appearing; quadriplegic in a wheelchair Psychiatric: alert and oriented x3 Eyes: extraocular movements intact Mouth: oral pharynx moist, no lesions Neck: supple no lymphadenopathy Cardiovascular: heart regular rate and rhythm Lungs: clear to auscultation  bilaterally Abdomen: soft, nontender, nondistended, no obvious ascites, no peritoneal signs, normal bowel sounds Extremities: no lower extremity edema bilaterally Skin: no lesions on visible extremities Rectal examination no obvious external anal hemorrhoids or internal anal hemorrhoids.  No obvious fissures.  Digital rectal exam was essentially normal.  With anoscopy there might be small internal hemorrhoids present.  Certainly no polyps or cancers.  Assessment and plan: 59 y.o. male with minor rectal bleeding in the setting of chronic anticoagulation, immobility  Digital rectal exam I believe exonerate anything serious such as neoplasm going on.  Indeed he had a colonoscopy just 4 years ago.  I do not think that needs to be repeated now.  I suspect he has intermittent small hemorrhoids.  In the setting of chronic anticoagulation these would certainly bleed more than if you were not on the anticoagulation.  I recommended he try to change his bowel regimen from daily digital manipulation to every other day digital manipulation.  I think daily trauma to his bottom might be causing local irritation, could contribute to bleeding especially in the setting of chronic anticoagulation.  To that end he is going to also add fiber supplements on a daily basis.  This might bulk his stool, allowing him to go every other day for his bowel regimen.  He will call to let me know how this is working in about 2 months.    Please see the "Patient Instructions" section for addition details about the plan.   Owens Loffler, MD [facility] 03/22/2020, 3:01  PM  Cc: Sueanne Margarita, DO  Total time on date of encounter was 35 minutes (this included time spent preparing to see the patient reviewing records; obtaining and/or reviewing separately obtained history; performing a medically appropriate exam and/or evaluation; counseling and educating the patient and family if present; ordering medications, tests or procedures if applicable; and documenting clinical information in the health record).

## 2020-04-19 ENCOUNTER — Telehealth: Payer: Self-pay | Admitting: *Deleted

## 2020-04-19 ENCOUNTER — Telehealth: Payer: Self-pay | Admitting: Podiatry

## 2020-04-19 NOTE — Telephone Encounter (Signed)
Spoke to pt and he decided to keep appt

## 2020-04-19 NOTE — Telephone Encounter (Signed)
Patient would like to reschedule for a later time or date. Please call.

## 2020-04-20 ENCOUNTER — Encounter: Payer: Self-pay | Admitting: Podiatry

## 2020-04-20 ENCOUNTER — Ambulatory Visit: Payer: Medicare Other | Admitting: Podiatry

## 2020-04-20 ENCOUNTER — Ambulatory Visit (INDEPENDENT_AMBULATORY_CARE_PROVIDER_SITE_OTHER): Payer: Medicare Other | Admitting: Podiatry

## 2020-04-20 ENCOUNTER — Other Ambulatory Visit: Payer: Self-pay

## 2020-04-20 VITALS — Temp 98.4°F

## 2020-04-20 DIAGNOSIS — B351 Tinea unguium: Secondary | ICD-10-CM | POA: Diagnosis not present

## 2020-04-20 DIAGNOSIS — G6289 Other specified polyneuropathies: Secondary | ICD-10-CM | POA: Diagnosis not present

## 2020-04-20 NOTE — Progress Notes (Signed)
This patient returns to my office for at risk foot care.  This patient requires this care by a professional since this patient will be at risk due to having due to coagulation defect due to xarelto.  Patient is quadraplegia  and in a wheelchair.This patient is unable to cut nails himself since the patient cannot reach his nails.These nails are painful walking and wearing shoes. This patient presents for at risk foot care today.  General Appearance  Alert, conversant and in no acute stress.  Vascular  Dorsalis pedis and posterior tibial  pulses are palpable  bilaterally.  Capillary return is within normal limits  bilaterally. Temperature is within normal limits  bilaterally.  Neurologic  Senn-Weinstein monofilament wire test within normal limits  bilaterally. Muscle power within normal limits bilaterally.  Nails Thick disfigured discolored nails with subungual debris  from hallux to fifth toes bilaterally. No evidence of bacterial infection or drainage bilaterally.   Orthopedic  No limitations of motion  feet .  No crepitus or effusions noted.  No bony pathology or digital deformities noted.  Skin  normotropic skin with no porokeratosis noted bilaterally.  No signs of infections or ulcers noted.      Onychomycosis  Pain in right toes  Pain in left toes  Consent was obtained for treatment procedures.   Mechanical debridement of nails 1-5  bilaterally performed with a nail nipper.  Filed with dremel without incident.    Return office visit   9 weeks                   Told patient to return for periodic foot care and evaluation due to potential at risk complications.   Wei Poplaski DPM  

## 2020-05-17 DIAGNOSIS — Z0000 Encounter for general adult medical examination without abnormal findings: Secondary | ICD-10-CM | POA: Insufficient documentation

## 2020-05-19 DIAGNOSIS — Z862 Personal history of diseases of the blood and blood-forming organs and certain disorders involving the immune mechanism: Secondary | ICD-10-CM | POA: Insufficient documentation

## 2020-05-19 DIAGNOSIS — K921 Melena: Secondary | ICD-10-CM | POA: Insufficient documentation

## 2020-05-19 DIAGNOSIS — Z7409 Other reduced mobility: Secondary | ICD-10-CM | POA: Insufficient documentation

## 2020-05-19 DIAGNOSIS — J3489 Other specified disorders of nose and nasal sinuses: Secondary | ICD-10-CM | POA: Insufficient documentation

## 2020-05-19 DIAGNOSIS — D6859 Other primary thrombophilia: Secondary | ICD-10-CM | POA: Insufficient documentation

## 2020-05-19 DIAGNOSIS — L97509 Non-pressure chronic ulcer of other part of unspecified foot with unspecified severity: Secondary | ICD-10-CM | POA: Insufficient documentation

## 2020-06-28 ENCOUNTER — Ambulatory Visit (INDEPENDENT_AMBULATORY_CARE_PROVIDER_SITE_OTHER): Payer: Medicare Other | Admitting: Podiatry

## 2020-06-28 ENCOUNTER — Other Ambulatory Visit: Payer: Self-pay

## 2020-06-28 ENCOUNTER — Encounter: Payer: Self-pay | Admitting: Podiatry

## 2020-06-28 DIAGNOSIS — R600 Localized edema: Secondary | ICD-10-CM | POA: Diagnosis not present

## 2020-06-28 DIAGNOSIS — G6289 Other specified polyneuropathies: Secondary | ICD-10-CM

## 2020-06-28 DIAGNOSIS — B351 Tinea unguium: Secondary | ICD-10-CM

## 2020-06-28 NOTE — Progress Notes (Signed)
This patient returns to my office for at risk foot care.  This patient requires this care by a professional since this patient will be at risk due to having due to coagulation defect due to xarelto.  Patient is quadraplegia  and in a wheelchair.This patient is unable to cut nails himself since the patient cannot reach his nails.These nails are painful walking and wearing shoes.  This patient presents for at risk foot care today.  General Appearance  Alert, conversant and in no acute stress.  Vascular  Dorsalis pedis and posterior tibial  pulses are palpable  bilaterally.  Capillary return is within normal limits  bilaterally. Temperature is within normal limits  bilaterally.  Neurologic  Senn-Weinstein monofilament wire test within normal limits  bilaterally. Muscle power within normal limits bilaterally.  Nails Thick disfigured discolored nails with subungual debris  from hallux to fifth toes bilaterally. No evidence of bacterial infection or drainage bilaterally.  Orthopedic  No limitations of motion  feet .  No crepitus or effusions noted.  No bony pathology or digital deformities noted.  Skin  normotropic skin with no porokeratosis noted bilaterally.  No signs of infections or ulcers noted.   Red inflamed left foot due to fall.    Onychomycosis  Pain in right toes  Pain in left toes  Consent was obtained for treatment procedures.   Mechanical debridement of nails 1-5  bilaterally performed with a nail nipper.  Filed with dremel without incident.    Return office visit   9 weeks                   Told patient to return for periodic foot care and evaluation due to potential at risk complications.   Gardiner Barefoot DPM

## 2020-06-30 ENCOUNTER — Telehealth: Payer: Self-pay | Admitting: Gastroenterology

## 2020-06-30 NOTE — Telephone Encounter (Signed)
Dr Ardis Hughs the pt is calling with an condition update.  He states he does not have any further rectal bleeding.  He is taking fiber as directed and states it is helping. He does continue to have some abd tenderness however.

## 2020-07-05 NOTE — Telephone Encounter (Signed)
The pt states that he has an appt with Dr Naaman Plummer tomorrow and will run it by him and call back if he decides to proceed.  He also states that the pain only happens when he does his in/out cath and has to press his abd to drain all the urine.

## 2020-07-05 NOTE — Telephone Encounter (Signed)
I am glad to hear that his bleeding has stopped.  Thanks for his abdominal tenderness and discomforts a CT scan abdomen pelvis is probably best.  I know this will be hard for him given his quadriplegia. Should be with IV and oral contrast.

## 2020-07-06 ENCOUNTER — Encounter: Payer: Medicare Other | Attending: Physical Medicine & Rehabilitation | Admitting: Physical Medicine & Rehabilitation

## 2020-07-06 ENCOUNTER — Encounter: Payer: Self-pay | Admitting: Physical Medicine & Rehabilitation

## 2020-07-06 ENCOUNTER — Other Ambulatory Visit: Payer: Self-pay

## 2020-07-06 VITALS — BP 103/66 | HR 67 | Temp 98.6°F | Ht 72.0 in | Wt 165.0 lb

## 2020-07-06 DIAGNOSIS — S14106S Unspecified injury at C6 level of cervical spinal cord, sequela: Secondary | ICD-10-CM

## 2020-07-06 DIAGNOSIS — K592 Neurogenic bowel, not elsewhere classified: Secondary | ICD-10-CM

## 2020-07-06 NOTE — Progress Notes (Signed)
Subjective:    Patient ID: Gary Burch, male    DOB: Feb 21, 1961, 59 y.o.   MRN: [id number]  HPI   [name] is here in follow-up of his C6 cervical spinal cord injury.  I last saw him about 1 year ago. A fewl weeks ago chair caught on the sidewalk and he fell out of the chair. He landed on his left shoulder and bruised his left foot. Xray imaging was negative for fx.   He follows  Up regularly with neurology. His caths usually about twice a day fo around 500cc's per.   From a bowel standpoint he had some bleeding. He is back on a more regular bowel program. He is using fiber the night before his AM glycerin suppository every other day along with some other dietary mods.   He reports intermittent abd pain, sometimes left, now more on right. It may be associated with his bowel program and sometimes the areas are sensitive to pressure/palpation  Skin has been intact. He has noticed increased edema LLE and asked if it was anything significant.     Pain Inventory Average Pain 3 Pain Right Now 2 My pain is intermittent, constant, sharp, tingling and aching  In the last 24 hours, has pain interfered with the following? General activity 4 Relation with others 3 Enjoyment of life 3 What TIME of day is your pain at its worst? varies Sleep (in general) fair to poor  Pain is worse with: some activites Pain improves with: therapy/exercise and medication Relief from Meds: 4  Family History  Problem Relation Age of Onset  . Colon polyps Father   . Parkinson's disease Father   . Alzheimer's disease Father   . Hypertension Mother   . Heart disease Mother    Social History   Socioeconomic History  . Marital status: Single    Spouse name: Not on file  . Number of children: Not on file  . Years of education: Not on file  . Highest education level: Not on file  Occupational History  . Not on file  Tobacco Use  . Smoking status: Never Smoker  . Smokeless tobacco: Never Used    Vaping Use  . Vaping Use: Never used  Substance and Sexual Activity  . Alcohol use: Yes    Alcohol/week: 0.0 standard drinks    Comment: occasional  . Drug use: No  . Sexual activity: Not Currently  Other Topics Concern  . Not on file  Social History Narrative  . Not on file   Social Determinants of Health   Financial Resource Strain:   . Difficulty of Paying Living Expenses:   Food Insecurity:   . Worried About Charity fundraiser in the Last Year:   . Arboriculturist in the Last Year:   Transportation Needs:   . Film/video editor (Medical):   Marland Kitchen Lack of Transportation (Non-Medical):   Physical Activity:   . Days of Exercise per Week:   . Minutes of Exercise per Session:   Stress:   . Feeling of Stress :   Social Connections:   . Frequency of Communication with Friends and Family:   . Frequency of Social Gatherings with Friends and Family:   . Attends Religious Services:   . Active Member of Clubs or Organizations:   . Attends Archivist Meetings:   Marland Kitchen Marital Status:    Past Surgical History:  Procedure Laterality Date  . COLONOSCOPY WITH PROPOFOL N/A 03/23/2015  Procedure: COLONOSCOPY WITH PROPOFOL;  Surgeon: Milus Banister, MD;  Location: [facility];  Service: Endoscopy;  Laterality: N/A;  . EYE SURGERY     bilateral cataract  . GAS/FLUID EXCHANGE Right 11/10/2015   Procedure: GAS/FLUID EXCHANGE- C3F8 10% used;  Surgeon: Hurman Horn, MD;  Location: [place];  Service: Ophthalmology;  Laterality: Right;  . PARS PLANA VITRECTOMY Right 11/10/2015   Procedure: PARS PLANA VITRECTOMY WITH 25 GAUGE with endolaser ;  Surgeon: Hurman Horn, MD;  Location: [facility];  Service: Ophthalmology;  Laterality: Right;  . RETINAL DETACHMENT SURGERY  nov  2016  . [place]   fusion  . TONSILLECTOMY     Past Surgical History:  Procedure Laterality Date  . COLONOSCOPY WITH PROPOFOL N/A 03/23/2015   Procedure: COLONOSCOPY WITH PROPOFOL;  Surgeon: Milus Banister, MD;  Location: [facility];  Service: Endoscopy;  Laterality: N/A;  . EYE SURGERY     bilateral cataract  . GAS/FLUID EXCHANGE Right 11/10/2015   Procedure: GAS/FLUID EXCHANGE- C3F8 10% used;  Surgeon: Hurman Horn, MD;  Location: [facility];  Service: Ophthalmology;  Laterality: Right;  . PARS PLANA VITRECTOMY Right 11/10/2015   Procedure: PARS PLANA VITRECTOMY WITH 25 GAUGE with endolaser ;  Surgeon: Hurman Horn, MD;  Location: [facility];  Service: Ophthalmology;  Laterality: Right;  . RETINAL DETACHMENT SURGERY  nov  2016  . [place]   fusion  . TONSILLECTOMY     Past Medical History:  Diagnosis Date  . Cancer ([place])    skin cancer, basal cell carcinoma above eye brow  . Cataracts, bilateral   . Cellulitis 01-2013   left leg  . DVT (deep venous thrombosis) ([place])   . Elevated liver enzymes    " once, as a response to medication"  . Heart murmur   . Kidney stone   . Pneumonia   . Retinal tear    bilateral  . Spine injury   . UTI (lower urinary tract infection)    BP 103/66   Pulse 67   Temp 98.6 F (37 C)   Ht 6' (1.829 m)   Wt 165 lb (74.8 kg) Comment: est per pt mortorized wheel chair  SpO2 97%   BMI 22.38 kg/m   Opioid Risk Score:   Fall Risk Score:  `1  Depression screen PHQ 2/9  Depression screen PHQ 2/9 07/08/2019  Decreased Interest 0  Down, Depressed, Hopeless 0  PHQ - 2 Score 0   Review of Systems     Objective:   Physical Exam  General: No acute distress HEENT: EOMI, oral membranes moist Cards: reg rate  Chest: normal effort Abdomen: Soft, NT, ND Skin: dry, intact Extremities: 1+ edema LLE  Musculoskeletal: elbow ROM normal Neurological: He is alert and oriented to person, place, and time.  Consistent weakness below the C6 level. DTR's 3+  continued clonus in LLE > RLE,   Psychiatric in good spirits.       Assessment & Plan:   1. C6 spinal cord injury, complete.  2. Right lateral epicondylitis with ulnar nerve irritation,  involving left too 3. Right rotator cuff syndrome  4. Neurogenic bowel and bladder  5. Thrombocytopenia: likely due to lyrica (delayed response).  6. Bilateral LE DVT's     Plan:  1. Continue xarelto for  life long anticoagulation.  2. Elevate, compress LLE    3. Bilateral LQ pain. ?retained stool. CT scan might be useful to assess  these areas.     4. Continue with bowel program. Discussed use of suppository as part of his am program. Suspect that some of abd pain is related to retained stool.  5. Bladder per urology. continue I/O caths BID 6. Padding and extension of elbows needs to continue   Fifteen minutes of face to face patient care time were spent during this visit. All questions were encouraged and answered.  Follow up with me in 1 year .

## 2020-07-06 NOTE — Patient Instructions (Addendum)
PLEASE FEEL FREE TO CALL OUR OFFICE WITH ANY PROBLEMS OR QUESTIONS ([phone])    TRY SOME LOOSE FITTING PANTS FOR A WEEK OR TWO TO SEE IF THAT AFFECTS YOUR ABDOMINAL PAIN.    I THINK A CT SCAN OF YOUR ABDOMEN MIGHT BE HELPFUL IN ASSESSING YOUR ABDOMINAL PAIN.

## 2020-07-07 ENCOUNTER — Other Ambulatory Visit: Payer: Self-pay

## 2020-07-07 DIAGNOSIS — R109 Unspecified abdominal pain: Secondary | ICD-10-CM

## 2020-07-07 NOTE — Telephone Encounter (Signed)
Left a message for the pt to call back with questions regarding the CT appt.  All information has been sent to the pt via My Chart

## 2020-07-07 NOTE — Telephone Encounter (Signed)
The pt has been given the number to call to reschedule his CT scan.  He will call today.

## 2020-07-07 NOTE — Telephone Encounter (Signed)
You have been scheduled for a CT scan of the abdomen and pelvis at [facility].   You are scheduled on  07/14/20  at   830 am . You should arrive 15 minutes prior to your appointment time for registration.    You will need to pick up contrast at [facility] at least 2 days prior to the appointment. Call [phone] if this appointment does   Not work for you.

## 2020-07-07 NOTE — Telephone Encounter (Signed)
You have been scheduled for a CT scan of the abdomen and pelvis at [facility].  You are scheduled on    at    . You should arrive 15 minutes prior to your appointment time for registration.   You will need to pick up contrast at [facility] at least 2 days prior to the appointment.

## 2020-07-07 NOTE — Telephone Encounter (Signed)
Patient is returning your call said he can not do mornings

## 2020-07-14 ENCOUNTER — Ambulatory Visit (HOSPITAL_COMMUNITY): Payer: Medicare Other

## 2020-07-19 ENCOUNTER — Encounter (HOSPITAL_COMMUNITY): Payer: Self-pay

## 2020-07-19 ENCOUNTER — Other Ambulatory Visit: Payer: Self-pay

## 2020-07-19 ENCOUNTER — Ambulatory Visit (HOSPITAL_COMMUNITY)
Admission: RE | Admit: 2020-07-19 | Discharge: 2020-07-19 | Disposition: A | Discharge status: Home / Self Care | Payer: Medicare Other | Source: Ambulatory Visit | Attending: Gastroenterology | Admitting: Gastroenterology

## 2020-07-19 DIAGNOSIS — R109 Unspecified abdominal pain: Secondary | ICD-10-CM | POA: Diagnosis present

## 2020-07-19 LAB — POCT I-STAT CREATININE: Creatinine, Ser: 0.7 mg/dL (ref 0.61–1.24)

## 2020-07-19 MED ORDER — IOHEXOL 300 MG/ML  SOLN
100.0000 mL | Freq: Once | INTRAMUSCULAR | Status: AC | PRN
  Administered 2020-07-19: 100 mL via INTRAVENOUS

## 2020-07-19 MED ORDER — SODIUM CHLORIDE (PF) 0.9 % IJ SOLN
INTRAMUSCULAR | Status: AC
  Filled 2020-07-19: qty 50

## 2020-07-26 ENCOUNTER — Telehealth: Payer: Self-pay

## 2020-07-26 NOTE — Telephone Encounter (Signed)
Patient called stating he woke up Saturday morning and tip of index finger was numb. Has not got back to normal. Called PCP and PCP advised him to call Dr. Naaman Plummer. Please advise.

## 2020-07-27 NOTE — Telephone Encounter (Signed)
Likely a transient compression neuropathy related to his sleeping position. It appears to be improving already. Some of the other associated symptoms we would traditionally see are likely masked by his C6 SCI

## 2020-07-28 ENCOUNTER — Telehealth: Payer: Self-pay | Admitting: Gastroenterology

## 2020-07-28 NOTE — Telephone Encounter (Signed)
PT called asking for her CT scan results be faxed over to his PCP at [facility]. I scheduled him for f/u with Dr. Ardis Hughs for 11/16 at 1:50pm.

## 2020-07-28 NOTE — Telephone Encounter (Signed)
I have sent the report as requested

## 2020-08-05 ENCOUNTER — Telehealth: Payer: Self-pay | Admitting: Gastroenterology

## 2020-08-05 NOTE — Telephone Encounter (Signed)
The pt states that he has what he believes is an internal hemorrhoid.  It does not cause pain and only has some slight bleeding at times.  Has an appt in a couple months and will keep that appt.  He was also advised to monitor it and call back if things worsen. The pt has been advised of the information and verbalized understanding.

## 2020-08-05 NOTE — Telephone Encounter (Signed)
Patient called to ask a question and states that he works along with two nurses and he was speaking with one of them about him having something that comes out of his rectum and goes back in not sure if its hemorrhoids but he would like to discuss this further with a nurse.

## 2020-08-05 NOTE — Telephone Encounter (Signed)
Return call to the pt and voice mail not set up.

## 2020-09-06 ENCOUNTER — Other Ambulatory Visit: Payer: Self-pay

## 2020-09-06 ENCOUNTER — Encounter: Payer: Self-pay | Admitting: Podiatry

## 2020-09-06 ENCOUNTER — Ambulatory Visit (INDEPENDENT_AMBULATORY_CARE_PROVIDER_SITE_OTHER): Payer: Medicare Other | Admitting: Podiatry

## 2020-09-06 DIAGNOSIS — B351 Tinea unguium: Secondary | ICD-10-CM

## 2020-09-06 DIAGNOSIS — G6289 Other specified polyneuropathies: Secondary | ICD-10-CM | POA: Diagnosis not present

## 2020-09-06 NOTE — Progress Notes (Signed)
This patient returns to my office for at risk foot care.  This patient requires this care by a professional since this patient will be at risk due to having due to coagulation defect due to xarelto.  Patient is quadraplegia  and in a wheelchair.This patient is unable to cut nails himself since the patient cannot reach his nails.These nails are painful walking and wearing shoes.  This patient presents for at risk foot care today.  General Appearance  Alert, conversant and in no acute stress.  Vascular  Dorsalis pedis and posterior tibial  pulses are palpable  bilaterally.  Capillary return is within normal limits  bilaterally. Temperature is within normal limits  bilaterally.  Neurologic  Senn-Weinstein monofilament wire test within normal limits  bilaterally. Muscle power within normal limits bilaterally.  Nails Thick disfigured discolored nails with subungual debris  from hallux to fifth toes bilaterally. No evidence of bacterial infection or drainage bilaterally.  Orthopedic  No limitations of motion  feet .  No crepitus or effusions noted.  No bony pathology or digital deformities noted.  Skin  normotropic skin with no porokeratosis noted bilaterally.  No signs of infections or ulcers noted.   Red inflamed left foot due to fall.    Onychomycosis  Pain in right toes  Pain in left toes  Consent was obtained for treatment procedures.   Mechanical debridement of nails 1-5  bilaterally performed with a nail nipper.  Filed with dremel without incident.    Return office visit   9 weeks                   Told patient to return for periodic foot care and evaluation due to potential at risk complications.   Gardiner Barefoot DPM

## 2020-10-04 ENCOUNTER — Ambulatory Visit (INDEPENDENT_AMBULATORY_CARE_PROVIDER_SITE_OTHER): Payer: Medicare Other | Admitting: Gastroenterology

## 2020-10-04 ENCOUNTER — Encounter: Payer: Self-pay | Admitting: Gastroenterology

## 2020-10-04 VITALS — BP 104/60 | HR 60

## 2020-10-04 DIAGNOSIS — K625 Hemorrhage of anus and rectum: Secondary | ICD-10-CM | POA: Diagnosis not present

## 2020-10-04 NOTE — Patient Instructions (Signed)
If you are age 57 or older, your body mass index should be between 23-30. Your There is no height or weight on file to calculate BMI. If this is out of the aforementioned range listed, please consider follow up with your Primary Care Provider.  If you are age 69 or younger, your body mass index should be between 19-25. Your There is no height or weight on file to calculate BMI. If this is out of the aformentioned range listed, please consider follow up with your Primary Care Provider.   You will follow up with our office in 1 year (November 2022) or sooner if needed.   Thank you for entrusting me with your care and choosing [facility].  Dr Ardis Hughs

## 2020-10-04 NOTE — Progress Notes (Signed)
Review of pertinent gastrointestinal problems: 1.Routine risk for colon cancer: normal colonsocopy5/2016 (done for cologuard + stool testing) 2.  Change in bowel habits, looser than usual stools September 2017.  GI pathogen panel was negative.  I recommend a single Imodium once daily.   HPI: This is a very pleasant 59 year old man chronic nature from quadriplegia.   I last saw him here in the office about 6 months ago.  He was having intermittent minor rectal bleeding in the setting of chronic anticoagulation, immobility.  I did a digital rectal exam on him using a anoscopy as well and it seemed there was a small internal hemorrhoid.  Since he had had a colonoscopy about 4 years prior I did not think that need to be repeated.  I recommended that he try fiber supplements in his diet to bulk his stools and make his stooling routine may be a little easier, digital manipulation every other day instead of every day.  He called 2 months later and it was clear that the fiber was helping.  He was having a tender spot in his abdomen however.  We ended up getting a CT scan to evaluate that better.  CT scan abdomen pelvis with IV and oral contrast August 2021 showed bladder wall thickening and trabeculation, mildly enlarged prostate gland otherwise essentially normal.  He is pretty happy with his new bowel regimen.  He is having digital stimulation every other day followed by sometimes digital evacuation.  This seems to be overall acceptable.  He feels like he is having fairly complete evacuations.  No bleeding.  He showed me a picture of red rash at his anus.  He called his dermatologist several days ago about this and they told him to put Lamisil at the site for 1 week.   ROS: complete GI ROS as described in HPI, all other review negative.  Constitutional:  No unintentional weight loss   Past Medical History:  Diagnosis Date  . Cataracts, bilateral   . Cellulitis 01-2013   left leg  . DVT (deep  venous thrombosis) ([place])   . Elevated liver enzymes    " once, as a response to medication"  . Heart murmur   . Kidney stone   . Pneumonia   . Retinal tear    bilateral  . Spine injury   . UTI (lower urinary tract infection)     Past Surgical History:  Procedure Laterality Date  . COLONOSCOPY WITH PROPOFOL N/A 03/23/2015   Procedure: COLONOSCOPY WITH PROPOFOL;  Surgeon: Milus Banister, MD;  Location: [facility];  Service: Endoscopy;  Laterality: N/A;  . EYE SURGERY     bilateral cataract  . GAS/FLUID EXCHANGE Right 11/10/2015   Procedure: GAS/FLUID EXCHANGE- C3F8 10% used;  Surgeon: Hurman Horn, MD;  Location: [place];  Service: Ophthalmology;  Laterality: Right;  . PARS PLANA VITRECTOMY Right 11/10/2015   Procedure: PARS PLANA VITRECTOMY WITH 25 GAUGE with endolaser ;  Surgeon: Hurman Horn, MD;  Location: [place];  Service: Ophthalmology;  Laterality: Right;  . RETINAL DETACHMENT SURGERY  nov  2016  . [place]   fusion  . TONSILLECTOMY      Current Outpatient Medications  Medication Sig Dispense Refill  . ampicillin (PRINCIPEN) 500 MG capsule Take 500 mg by mouth daily.    . baclofen (LIORESAL) 10 MG tablet Take 10 mg by mouth 4 (four) times daily.    . bethanechol (URECHOLINE) 25 MG tablet Take 50 mg by mouth  3 (three) times daily.    . Cetirizine-Pseudoephedrine (ZYRTEC-D PO) Take 1 tablet by mouth at bedtime.     . clindamycin (CLEOCIN T) 1 % lotion APPLY 1 APPLICATION ON THE SKIN IN THE MORNING  2  . clindamycin-benzoyl peroxide (BENZACLIN) gel Apply 1 application topically daily as needed (for acne).     . clotrimazole-betamethasone (LOTRISONE) cream APPLY SMALL AMOUNT TO AFFECTED AREA TWICE A DAY FOR 1 2 WEEKS    . Cranberry 450 MG TABS Take 1 tablet by mouth daily.    . diazepam (VALIUM) 2 MG tablet Take 2 mg by mouth 2 (two) times daily.    Marland Kitchen doxazosin (CARDURA) 4 MG tablet Take 4 mg by mouth daily.    . Multiple Vitamin (MULTIVITAMIN WITH MINERALS) TABS  tablet Take 1 tablet by mouth daily.    . mupirocin ointment (BACTROBAN) 2 % Apply 1 application topically daily as needed (for skin).   2  . pregabalin (LYRICA) 75 MG capsule Take 150 mg by mouth 2 (two) times daily.     . Probiotic Product (PROBIOTIC DAILY) CAPS Take 1 capsule by mouth daily.    . SF 5000 PLUS 1.1 % CREA dental cream Place 1 application onto teeth at bedtime.   4  . tretinoin (RETIN-A) 0.1 % cream Apply 1 application topically at bedtime.    . triamcinolone ointment (KENALOG) 0.1 % APPLY A SMALL DAB TO FINGER AND PLACE IN EXTERNAL EAR CANALS FOR ITCHING, TWICE DAILY PRN.    Marland Kitchen UNABLE TO FIND Take by mouth.    . Vitamins/Minerals TABS Take by mouth.    Alveda Reasons 15 MG TABS tablet     . Loperamide HCl (IMODIUM PO) Take by mouth daily. (Patient not taking: Reported on 10/04/2020)     No current facility-administered medications for this visit.    Allergies as of 10/04/2020 - Review Complete 10/04/2020  Allergen Reaction Noted  . Macrodantin Rash 01/30/2012  . Sulfa antibiotics Rash 01/30/2012    Family History  Problem Relation Age of Onset  . Colon polyps Father   . Parkinson's disease Father   . Alzheimer's disease Father   . Hypertension Mother   . Heart disease Mother     Social History   Socioeconomic History  . Marital status: Single    Spouse name: Not on file  . Number of children: Not on file  . Years of education: Not on file  . Highest education level: Not on file  Occupational History  . Not on file  Tobacco Use  . Smoking status: Never Smoker  . Smokeless tobacco: Never Used  Vaping Use  . Vaping Use: Never used  Substance and Sexual Activity  . Alcohol use: Yes    Alcohol/week: 0.0 standard drinks    Comment: occasional  . Drug use: No  . Sexual activity: Not Currently  Other Topics Concern  . Not on file  Social History Narrative  . Not on file   Social Determinants of Health   Financial Resource Strain:   . Difficulty of Paying  Living Expenses: Not on file  Food Insecurity:   . Worried About Charity fundraiser in the Last Year: Not on file  . Ran Out of Food in the Last Year: Not on file  Transportation Needs:   . Lack of Transportation (Medical): Not on file  . Lack of Transportation (Non-Medical): Not on file  Physical Activity:   . Days of Exercise per Week: Not on file  .  Minutes of Exercise per Session: Not on file  Stress:   . Feeling of Stress : Not on file  Social Connections:   . Frequency of Communication with Friends and Family: Not on file  . Frequency of Social Gatherings with Friends and Family: Not on file  . Attends Religious Services: Not on file  . Active Member of Clubs or Organizations: Not on file  . Attends Archivist Meetings: Not on file  . Marital Status: Not on file  Intimate Partner Violence:   . Fear of Current or Ex-Partner: Not on file  . Emotionally Abused: Not on file  . Physically Abused: Not on file  . Sexually Abused: Not on file     Physical Exam: BP 104/60 (BP Location: Left Arm, Patient Position: Sitting, Cuff Size: Normal)   Pulse 60  Constitutional: Sitting in a wheelchair, quadriplegic although he has some function of his arms hands Psychiatric: alert and oriented x3 Abdomen: soft, nontender, nondistended, no obvious ascites, no peritoneal signs, normal bowel sounds No peripheral edema noted in lower extremities  Assessment and plan: 59 y.o. male with spinal cord injury induced bowel troubles  He is going to continue with bowel regimen every other day.  He will return to see me in 1 year.  He does see intermittent rectal bleeding that is likely related to digital manipulation and the fact that he is on a blood thinner.  We have deferred on colonoscopies up till now.  He is not so enthused about having one sooner than he needs to however maybe he would not want to wait 10 years.  We will discuss this again in 1 year when he returns for his annual  follow-up.  He knows to call here sooner if he has troubles or concerns prior to then.  Please see the "Patient Instructions" section for addition details about the plan.  Owens Loffler, MD [facility] 10/04/2020, 2:22 PM   Total time on date of encounter was 25 minutes (this included time spent preparing to see the patient reviewing records; obtaining and/or reviewing separately obtained history; performing a medically appropriate exam and/or evaluation; counseling and educating the patient and family if present; ordering medications, tests or procedures if applicable; and documenting clinical information in the health record).

## 2020-11-16 ENCOUNTER — Ambulatory Visit: Payer: Medicare Other | Admitting: Podiatry

## 2020-11-30 ENCOUNTER — Encounter: Payer: Self-pay | Admitting: Podiatry

## 2020-11-30 ENCOUNTER — Other Ambulatory Visit: Payer: Self-pay

## 2020-11-30 ENCOUNTER — Ambulatory Visit (INDEPENDENT_AMBULATORY_CARE_PROVIDER_SITE_OTHER): Payer: Medicare Other | Admitting: Podiatry

## 2020-11-30 DIAGNOSIS — R600 Localized edema: Secondary | ICD-10-CM

## 2020-11-30 DIAGNOSIS — G6289 Other specified polyneuropathies: Secondary | ICD-10-CM

## 2020-11-30 DIAGNOSIS — B351 Tinea unguium: Secondary | ICD-10-CM

## 2020-11-30 NOTE — Progress Notes (Signed)
This patient returns to my office for at risk foot care.  This patient requires this care by a professional since this patient will be at risk due to having due to coagulation defect due to xarelto.  Patient is quadraplegia  and in a wheelchair.This patient is unable to cut nails himself since the patient cannot reach his nails.These nails are painful walking and wearing shoes.  This patient presents for at risk foot care today.  General Appearance  Alert, conversant and in no acute stress.  Vascular  Dorsalis pedis and posterior tibial  pulses are palpable  bilaterally.  Capillary return is within normal limits  bilaterally. Temperature is within normal limits  bilaterally.  Neurologic  Senn-Weinstein monofilament wire test within normal limits  bilaterally. Muscle power within normal limits bilaterally.  Nails Thick disfigured discolored nails with subungual debris  from hallux to fifth toes bilaterally. No evidence of bacterial infection or drainage bilaterally.  Orthopedic  No limitations of motion  feet .  No crepitus or effusions noted.  No bony pathology or digital deformities noted.  Skin  normotropic skin with no porokeratosis noted bilaterally.  No signs of infections or ulcers noted.   Red inflamed left foot due to fall.    Onychomycosis  Pain in right toes  Pain in left toes  Consent was obtained for treatment procedures.   Mechanical debridement of nails 1-5  bilaterally performed with a nail nipper.  Filed with dremel without incident.    Return office visit   9 weeks                   Told patient to return for periodic foot care and evaluation due to potential at risk complications.   Gardiner Barefoot DPM

## 2021-01-27 ENCOUNTER — Telehealth: Payer: Self-pay

## 2021-01-27 NOTE — Telephone Encounter (Signed)
Dr Francesco Sor from [facility] sent last office note from 01-23-2021 indicating that patient is still having complications of constipation.  Dr Francesco Sor had talked with drug rep for Cologuard, who thought a Cologuard could be completed even though patient has had a postive test in the past that was thought to be a false negative. Dr Francesco Sor deferred case to Dr Ardis Hughs.  Dr Ardis Hughs reviewed office note indicating that patient is using fiber crackers and metamucil to help with constipation. Dr Francesco Sor was hesitant to use Miralax, but doesn't want patient to go too often with no one in the home to help him.  Dr Ardis Hughs recommended patient return to office visit at his next available appointment.  Patient was contacted with Dr Ardis Hughs' recommendations of an office visit, but patient refused.  Patient stated that his constipation is no worse than it was in November 2021, and he did not feel it was necessary to come into the office.  Patient stated he would continue to monitor his bowel movements, and if constipation gets worse he would contact the office for an appointment.  No further questions.

## 2021-02-21 ENCOUNTER — Encounter: Payer: Self-pay | Admitting: Podiatry

## 2021-02-21 ENCOUNTER — Ambulatory Visit (INDEPENDENT_AMBULATORY_CARE_PROVIDER_SITE_OTHER): Payer: Medicare Other | Admitting: Podiatry

## 2021-02-21 ENCOUNTER — Other Ambulatory Visit: Payer: Self-pay

## 2021-02-21 DIAGNOSIS — B351 Tinea unguium: Secondary | ICD-10-CM

## 2021-02-21 DIAGNOSIS — G6289 Other specified polyneuropathies: Secondary | ICD-10-CM

## 2021-02-21 NOTE — Progress Notes (Signed)
This patient returns to my office for at risk foot care.  This patient requires this care by a professional since this patient will be at risk due to having due to coagulation defect due to xarelto.  Patient is quadraplegia  and in a wheelchair.This patient is unable to cut nails himself since the patient cannot reach his nails.These nails are painful walking and wearing shoes.  This patient presents for at risk foot care today.  General Appearance  Alert, conversant and in no acute stress.  Vascular  Dorsalis pedis and posterior tibial  pulses are palpable  bilaterally.  Capillary return is within normal limits  bilaterally. Temperature is within normal limits  bilaterally.  Neurologic  Senn-Weinstein monofilament wire test within normal limits  bilaterally. Muscle power within normal limits bilaterally.  Nails Thick disfigured discolored nails with subungual debris  from hallux to fifth toes bilaterally. No evidence of bacterial infection or drainage bilaterally.  Orthopedic  No limitations of motion  feet .  No crepitus or effusions noted.  No bony pathology or digital deformities noted.  Skin  normotropic skin with no porokeratosis noted bilaterally.  No signs of infections or ulcers noted.   Red inflamed left foot due to fall.    Onychomycosis  Pain in right toes  Pain in left toes  Consent was obtained for treatment procedures.   Mechanical debridement of nails 1-5  bilaterally performed with a nail nipper.  Filed with dremel without incident.    Return office visit   9 weeks                   Told patient to return for periodic foot care and evaluation due to potential at risk complications.   Gardiner Barefoot DPM

## 2021-05-03 ENCOUNTER — Other Ambulatory Visit: Payer: Self-pay

## 2021-05-03 ENCOUNTER — Encounter: Payer: Self-pay | Admitting: Podiatry

## 2021-05-03 ENCOUNTER — Ambulatory Visit (INDEPENDENT_AMBULATORY_CARE_PROVIDER_SITE_OTHER): Payer: Medicare Other | Admitting: Podiatry

## 2021-05-03 DIAGNOSIS — B351 Tinea unguium: Secondary | ICD-10-CM

## 2021-05-03 DIAGNOSIS — K5900 Constipation, unspecified: Secondary | ICD-10-CM | POA: Insufficient documentation

## 2021-05-03 DIAGNOSIS — G6289 Other specified polyneuropathies: Secondary | ICD-10-CM | POA: Diagnosis not present

## 2021-05-03 DIAGNOSIS — R600 Localized edema: Secondary | ICD-10-CM

## 2021-05-03 DIAGNOSIS — R609 Edema, unspecified: Secondary | ICD-10-CM | POA: Insufficient documentation

## 2021-05-03 DIAGNOSIS — M79605 Pain in left leg: Secondary | ICD-10-CM | POA: Insufficient documentation

## 2021-05-03 NOTE — Progress Notes (Signed)
This patient returns to my office for at risk foot care.  This patient requires this care by a professional since this patient will be at risk due to having due to coagulation defect due to xarelto.  Patient is quadraplegia  and in a wheelchair.This patient is unable to cut nails himself since the patient cannot reach his nails.These nails are painful walking and wearing shoes.  This patient presents for at risk foot care today.  General Appearance  Alert, conversant and in no acute stress.  Vascular  Dorsalis pedis and posterior tibial  pulses are palpable  bilaterally.  Capillary return is within normal limits  bilaterally. Temperature is within normal limits  bilaterally.  Neurologic  Senn-Weinstein monofilament wire test within normal limits  bilaterally. Muscle power within normal limits bilaterally.  Nails Thick disfigured discolored nails with subungual debris  from hallux to fifth toes bilaterally. No evidence of bacterial infection or drainage bilaterally.  Orthopedic  No limitations of motion  feet .  No crepitus or effusions noted.  No bony pathology or digital deformities noted.  Skin  normotropic skin with no porokeratosis noted bilaterally.  No signs of infections or ulcers noted.   Red inflamed left foot due to fall.    Onychomycosis  Pain in right toes  Pain in left toes  Consent was obtained for treatment procedures.   Mechanical debridement of nails 1-5  bilaterally performed with a nail nipper.  Filed with dremel without incident.    Return office visit   9 weeks                   Told patient to return for periodic foot care and evaluation due to potential at risk complications.   Gardiner Barefoot DPM

## 2021-05-18 ENCOUNTER — Telehealth: Payer: Self-pay | Admitting: Podiatry

## 2021-05-18 ENCOUNTER — Encounter: Payer: Self-pay | Admitting: Podiatry

## 2021-05-18 NOTE — Telephone Encounter (Signed)
Called patient lvm to reschedule 8/22 appt and sent letter

## 2021-07-05 ENCOUNTER — Encounter: Payer: Self-pay | Admitting: Physical Medicine & Rehabilitation

## 2021-07-05 ENCOUNTER — Encounter: Payer: Medicare Other | Attending: Physical Medicine & Rehabilitation | Admitting: Physical Medicine & Rehabilitation

## 2021-07-05 ENCOUNTER — Other Ambulatory Visit: Payer: Self-pay

## 2021-07-05 VITALS — BP 93/56 | HR 54 | Temp 98.6°F | Ht 72.0 in | Wt 165.0 lb

## 2021-07-05 DIAGNOSIS — S14106S Unspecified injury at C6 level of cervical spinal cord, sequela: Secondary | ICD-10-CM | POA: Diagnosis present

## 2021-07-05 DIAGNOSIS — G8250 Quadriplegia, unspecified: Secondary | ICD-10-CM

## 2021-07-05 NOTE — Patient Instructions (Signed)
PLEASE FEEL FREE TO CALL OUR OFFICE WITH ANY PROBLEMS OR QUESTIONS ([phone])      

## 2021-07-05 NOTE — Progress Notes (Signed)
Subjective:    Patient ID: Gary Burch, male    DOB: 08-Aug-1961, 60 y.o.   MRN: [id number]  HPI Gary Burch is here in follow up of his C7 SCI.  For the most part he feels that things have been fairly stable.  He reports no changes with his skin. He gets some irritation from his blankets a night when he lays on his right or left side.  He caths 2x per day. Recent u/s was WNL  From a standpoint of his bowels, his aides do a bit dig stim daily which is fairly effective. He bought a hand rectal stimulator to help do his bowel program when he's alone.   He says he sees podiatry for management of his toenails.  He has noticed that his left fifth toenail is catching a lot on his socks when he dons and doff them.  He has had some intermittent left shoulder pain which was worsened by doing some resistance exercises at home.  He reports some occasional tremors in the left hand but is usually when he is trying to use the hand or when he is fatigued.  He has some concerns as his brother was diagnosed with Parkinson's disease and his father had it as well.  Denies any issues with rigidity or resting tremors.  He said no problems with swallowing cognition etc.   Pain Inventory Average Pain 3 Pain Right Now 2 My pain is constant and tingling  LOCATION OF PAIN  thigh , shoulder , elbow,   BOWEL Number of stools per week: 3-5 Oral laxative use Yes  Type of laxative citrucel Enema or suppository use No  History of colostomy No  Incontinent No   BLADDER  In and out cath, frequency 2 a day Able to self cath Yes  Bladder incontinence No  Frequent urination No  Leakage with coughing No  Difficulty starting stream No  Incomplete bladder emptying No    Mobility use a wheelchair needs help with transfers  Function not employed: date last employed n/a I need assistance with the following:  dressing, bathing, toileting, meal prep, household duties, and  shopping  Neuro/Psych weakness numbness spasms  Prior Studies Ct scan abdomen  Physicians involved in your care N/a   Family History  Problem Relation Age of Onset   Colon polyps Father    Parkinson's disease Father    Alzheimer's disease Father    Hypertension Mother    Heart disease Mother    Social History   Socioeconomic History   Marital status: Single    Spouse name: Not on file   Number of children: Not on file   Years of education: Not on file   Highest education level: Not on file  Occupational History   Not on file  Tobacco Use   Smoking status: Never   Smokeless tobacco: Never  Vaping Use   Vaping Use: Never used  Substance and Sexual Activity   Alcohol use: Yes    Alcohol/week: 0.0 standard drinks    Comment: occasional   Drug use: No   Sexual activity: Not Currently  Other Topics Concern   Not on file  Social History Narrative   Not on file   Social Determinants of Health   Financial Resource Strain: Not on file  Food Insecurity: Not on file  Transportation Needs: Not on file  Physical Activity: Not on file  Stress: Not on file  Social Connections: Not on file   Past Surgical History:  Procedure Laterality Date   COLONOSCOPY WITH PROPOFOL N/A 03/23/2015   Procedure: COLONOSCOPY WITH PROPOFOL;  Surgeon: Milus Banister, MD;  Location: [facility];  Service: Endoscopy;  Laterality: N/A;   EYE SURGERY     bilateral cataract   GAS/FLUID EXCHANGE Right 11/10/2015   Procedure: GAS/FLUID EXCHANGE- C3F8 10% used;  Surgeon: Hurman Horn, MD;  Location: [place];  Service: Ophthalmology;  Laterality: Right;   PARS PLANA VITRECTOMY Right 11/10/2015   Procedure: PARS PLANA VITRECTOMY WITH 25 GAUGE with endolaser ;  Surgeon: Hurman Horn, MD;  Location: [place];  Service: Ophthalmology;  Laterality: Right;   RETINAL DETACHMENT SURGERY  nov  2016   SPINE SURGERY  1981   fusion   TONSILLECTOMY     Past Medical History:  Diagnosis Date   Cataracts,  bilateral    Cellulitis 01-2013   left leg   DVT (deep venous thrombosis) (HCC)    Elevated liver enzymes    " once, as a response to medication"   Heart murmur    Kidney stone    Pneumonia    Retinal tear    bilateral   Spine injury    UTI (lower urinary tract infection)    BP (!) 93/56   Pulse (!) 54   Temp 98.6 F (37 C) (Oral)   Ht 6' (1.829 m)   Wt 165 lb (74.8 kg)   SpO2 94%   BMI 22.38 kg/m   Opioid Risk Score:   Fall Risk Score:  `1  Depression screen PHQ 2/9  Depression screen [facility] 2/9 07/06/2020 07/08/2019  Decreased Interest 0 0  Down, Depressed, Hopeless 0 0  PHQ - 2 Score 0 0       Review of Systems  Constitutional: Negative.   HENT: Negative.    Eyes: Negative.   Respiratory: Negative.    Cardiovascular: Negative.   Gastrointestinal: Negative.   Endocrine: Negative.   Genitourinary: Negative.   Musculoskeletal:  Positive for back pain and gait problem.       Pain in shoulder , elbow, thigh, nerve pain  Skin: Negative.   Allergic/Immunologic: Negative.   Neurological:  Positive for weakness and numbness.  Hematological: Negative.   Psychiatric/Behavioral: Negative.        Objective:   Physical Exam  General: No acute distress HEENT: NCAT, EOMI, oral membranes moist Cards: reg rate  Chest: normal effort Abdomen: Soft, NT, ND Skin: dry, intact.  He has a loose nail on the left fifth toe which is catching on his socks Extremities: no edema Psych: pleasant and appropriate   Musculoskeletal: elbow ROM normal Neurological: He is alert and oriented to person, place, and time.  Consistent weakness below the C6 level. DTR's 3+  continued clonus in LLE > RLE,         Assessment & Plan:   1. C6 spinal cord injury, complete.  2. Right lateral epicondylitis with ulnar nerve irritation, involving left too 3. Right rotator cuff syndrome  4. Neurogenic bowel and bladder  5. Thrombocytopenia: likely due to lyrica (delayed response).  6.  Bilateral LE DVT's     Plan:  1. Continue xarelto for  life long anticoagulation.  2. Elevate, compress LLE .  Needs to see podiatrist for nail clipping.3. Bowel program.via digital stem.  We discussed adaptations to his stimulation device.  I wrote a prescription for biotech to add a above the wrist strap to help secure the splint better on his hand.Marland Kitchen  4. C continue home exercise program as able.  Avoid excessive resistance exercise especially with the shoulders..  5. Bladder per urology. continue I/O caths BID 6. Padding and extension of elbows needs to continue. W/c is in reasonable shape at present.    15 minutes of face to face patient care time were spent during this visit. All questions were encouraged and answered.  Follow up with me in 1 year .

## 2021-07-10 ENCOUNTER — Ambulatory Visit: Payer: Medicare Other | Admitting: Podiatry

## 2021-07-17 ENCOUNTER — Encounter: Payer: Self-pay | Admitting: Podiatry

## 2021-07-17 ENCOUNTER — Ambulatory Visit (INDEPENDENT_AMBULATORY_CARE_PROVIDER_SITE_OTHER): Payer: Medicare Other | Admitting: Podiatry

## 2021-07-17 ENCOUNTER — Other Ambulatory Visit: Payer: Self-pay

## 2021-07-17 DIAGNOSIS — G6289 Other specified polyneuropathies: Secondary | ICD-10-CM | POA: Diagnosis not present

## 2021-07-17 DIAGNOSIS — R600 Localized edema: Secondary | ICD-10-CM | POA: Diagnosis not present

## 2021-07-17 DIAGNOSIS — B351 Tinea unguium: Secondary | ICD-10-CM

## 2021-07-17 NOTE — Progress Notes (Signed)
This patient returns to my office for at risk foot care.  This patient requires this care by a professional since this patient will be at risk due to having due to coagulation defect due to xarelto.  Patient is quadraplegia  and in a wheelchair.This patient is unable to cut nails himself since the patient cannot reach his nails.These nails are painful walking and wearing shoes. Patient is wearing a band-aid over fifth toenail left foot. This patient presents for at risk foot care today.  General Appearance  Alert, conversant and in no acute stress.  Vascular  Dorsalis pedis and posterior tibial  pulses are palpable  bilaterally.  Capillary return is within normal limits  bilaterally. Temperature is within normal limits  bilaterally.  Neurologic  Senn-Weinstein monofilament wire test within normal limits  bilaterally. Muscle power within normal limits bilaterally.  Nails Thick disfigured discolored nails with subungual debris  from hallux to fifth toes bilaterally. No evidence of bacterial infection or drainage bilaterally. Avulsed fifth toenail left foot.  Orthopedic  No limitations of motion  feet .  No crepitus or effusions noted.  No bony pathology or digital deformities noted.  Skin  normotropic skin with no porokeratosis noted bilaterally.  No signs of infections or ulcers noted.   Red inflamed left foot due to fall.    Onychomycosis  Pain in right toes  Pain in left toes  Consent was obtained for treatment procedures.   Mechanical debridement of nails 1-5  bilaterally performed with a nail nipper.  Filed with dremel without incident.    Return office visit   9 weeks                   Told patient to return for periodic foot care and evaluation due to potential at risk complications.   Gardiner Barefoot DPM

## 2021-08-08 ENCOUNTER — Other Ambulatory Visit (HOSPITAL_COMMUNITY): Payer: Self-pay | Admitting: Internal Medicine

## 2021-08-08 ENCOUNTER — Other Ambulatory Visit: Payer: Self-pay | Admitting: Internal Medicine

## 2021-08-08 ENCOUNTER — Ambulatory Visit: Payer: Medicare Other

## 2021-08-08 DIAGNOSIS — K5900 Constipation, unspecified: Secondary | ICD-10-CM

## 2021-08-08 DIAGNOSIS — R1030 Lower abdominal pain, unspecified: Secondary | ICD-10-CM

## 2021-08-09 ENCOUNTER — Other Ambulatory Visit: Payer: Self-pay

## 2021-08-09 ENCOUNTER — Ambulatory Visit (HOSPITAL_COMMUNITY)
Admission: RE | Admit: 2021-08-09 | Discharge: 2021-08-09 | Disposition: A | Discharge status: Home / Self Care | Payer: Medicare Other | Source: Ambulatory Visit | Attending: Internal Medicine | Admitting: Internal Medicine

## 2021-08-09 DIAGNOSIS — K5900 Constipation, unspecified: Secondary | ICD-10-CM | POA: Diagnosis present

## 2021-08-09 DIAGNOSIS — R1030 Lower abdominal pain, unspecified: Secondary | ICD-10-CM | POA: Insufficient documentation

## 2021-09-19 ENCOUNTER — Ambulatory Visit: Payer: Medicare Other | Admitting: Podiatry

## 2021-09-19 HISTORY — PX: RETINAL TEAR REPAIR CRYOTHERAPY: SHX5304

## 2021-09-20 ENCOUNTER — Encounter: Payer: Self-pay | Admitting: Gastroenterology

## 2021-09-20 ENCOUNTER — Ambulatory Visit (INDEPENDENT_AMBULATORY_CARE_PROVIDER_SITE_OTHER): Payer: Medicare Other | Admitting: Podiatry

## 2021-09-20 ENCOUNTER — Ambulatory Visit (INDEPENDENT_AMBULATORY_CARE_PROVIDER_SITE_OTHER): Payer: Medicare Other | Admitting: Gastroenterology

## 2021-09-20 ENCOUNTER — Encounter: Payer: Self-pay | Admitting: Podiatry

## 2021-09-20 ENCOUNTER — Other Ambulatory Visit: Payer: Self-pay

## 2021-09-20 VITALS — BP 102/62

## 2021-09-20 DIAGNOSIS — G6289 Other specified polyneuropathies: Secondary | ICD-10-CM | POA: Diagnosis not present

## 2021-09-20 DIAGNOSIS — B351 Tinea unguium: Secondary | ICD-10-CM | POA: Diagnosis not present

## 2021-09-20 DIAGNOSIS — K625 Hemorrhage of anus and rectum: Secondary | ICD-10-CM

## 2021-09-20 NOTE — Patient Instructions (Signed)
If you are age 58 or younger, your body mass index should be between 19-25. Your There is no height or weight on file to calculate BMI. If this is out of the aformentioned range listed, please consider follow up with your Primary Care Provider.   ________________________________________________________  The [facility] providers would like to encourage you to use [facility] to communicate with providers for non-urgent requests or questions.  Due to long hold times on the telephone, sending your provider a message by [facility] may be a faster and more efficient way to get a response.  Please allow 48 business hours for a response.  Please remember that this is for non-urgent requests.  _______________________________________________________  Gary Burch will receive a call from our office in March 2023 to set up admission for colonoscopy at [facility].  Thank you for entrusting me with your care and choosing [facility].  Dr Ardis Hughs

## 2021-09-20 NOTE — Progress Notes (Signed)
This patient returns to my office for at risk foot care.  This patient requires this care by a professional since this patient will be at risk due to having due to coagulation defect due to xarelto.  Patient is quadraplegia  and in a wheelchair.This patient is unable to cut nails himself since the patient cannot reach his nails.These nails are painful walking and wearing shoes. This patient presents for at risk foot care today.  General Appearance  Alert, conversant and in no acute stress.  Vascular  Dorsalis pedis and posterior tibial  pulses are palpable  bilaterally.  Capillary return is within normal limits  bilaterally. Temperature is within normal limits  bilaterally.  Neurologic  Senn-Weinstein monofilament wire test within normal limits  bilaterally. Muscle power within normal limits bilaterally.  Nails Thick disfigured discolored nails with subungual debris  from hallux to fifth toes bilaterally. No evidence of bacterial infection or drainage bilaterally. Avulsed fifth toenail left foot.  Orthopedic  No limitations of motion  feet .  No crepitus or effusions noted.  No bony pathology or digital deformities noted.  Skin  normotropic skin with no porokeratosis noted bilaterally.  No signs of infections or ulcers noted.      Onychomycosis  Pain in right toes  Pain in left toes  Consent was obtained for treatment procedures.   Mechanical debridement of nails 1-5  bilaterally performed with a nail nipper.  Filed with dremel without incident.    Return office visit   9 weeks                   Told patient to return for periodic foot care and evaluation due to potential at risk complications.   Gardiner Barefoot DPM

## 2021-09-20 NOTE — Progress Notes (Signed)
Review of pertinent gastrointestinal problems: 1. Routine risk for colon cancer: normal colonsocopy 03/2015 (done for cologuard + stool testing) 2.  Change in bowel habits, looser than usual stools September 2017.  GI pathogen panel was negative.  I recommend a single Imodium once daily. 3.  Intermittent minor rectal bleeding evaluated 2021 anoscopy look like there is a small internal hemorrhoid.  I recommended he try fiber supplements to bulk his stools.  Digital rectal exam, manipulation as part of his bowel regimen likely contributes.  HPI: This is a very pleasant 60 year old man who is wheelchair-bound from spinal cord injury remotely.  Abdominal ultrasound September 2022 for lower abdominal pain and constipation showed gallstones in his gallbladder.  He sees blood in his stool 2-3 times per month.  This can be quite a lot of blood.  He has a bowel regimen where he is digitally stimulated every other day by home health aide.  He has intermittent abdominal discomforts.   ROS: complete GI ROS as described in HPI, all other review negative.  Constitutional:  No unintentional weight loss   Past Medical History:  Diagnosis Date   Cataracts, bilateral    Cellulitis 01-2013   left leg   DVT (deep venous thrombosis) (HCC)    Elevated liver enzymes    " once, as a response to medication"   Heart murmur    Kidney stone    Pneumonia    Retinal tear    bilateral   Spine injury    UTI (lower urinary tract infection)     Past Surgical History:  Procedure Laterality Date   COLONOSCOPY WITH PROPOFOL N/A 03/23/2015   Procedure: COLONOSCOPY WITH PROPOFOL;  Surgeon: Milus Banister, MD;  Location: [facility];  Service: Endoscopy;  Laterality: N/A;   EYE SURGERY     bilateral cataract   GAS/FLUID EXCHANGE Right 11/10/2015   Procedure: GAS/FLUID EXCHANGE- C3F8 10% used;  Surgeon: Hurman Horn, MD;  Location: [place];  Service: Ophthalmology;  Laterality: Right;   PARS PLANA VITRECTOMY  Right 11/10/2015   Procedure: PARS PLANA VITRECTOMY WITH 25 GAUGE with endolaser ;  Surgeon: Hurman Horn, MD;  Location: [place];  Service: Ophthalmology;  Laterality: Right;   RETINAL DETACHMENT SURGERY  09/20/2015   RETINAL TEAR REPAIR CRYOTHERAPY     SPINE SURGERY  11/20/1979   fusion   TONSILLECTOMY      Current Outpatient Medications  Medication Sig Dispense Refill   ampicillin (PRINCIPEN) 500 MG capsule Take 500 mg by mouth daily.     baclofen (LIORESAL) 10 MG tablet Take 1 tablet by mouth 4 (four) times daily.     bethanechol (URECHOLINE) 25 MG tablet Take 50 mg by mouth 3 (three) times daily.     clindamycin (CLEOCIN T) 1 % lotion APPLY 1 APPLICATION ON THE SKIN IN THE MORNING  2   clindamycin-benzoyl peroxide (BENZACLIN) gel Apply 1 application topically daily as needed (for acne).      clindamycin-tretinoin (ZIANA) gel See admin instructions.     clotrimazole-betamethasone (LOTRISONE) cream APPLY SMALL AMOUNT TO AFFECTED AREA TWICE A DAY FOR 1 2 WEEKS     Cranberry 425 MG CAPS take once a day     Cranberry 450 MG TABS Take 1 tablet by mouth daily.     diazepam (VALIUM) 2 MG tablet Take 2 mg by mouth 2 (two) times daily.     doxazosin (CARDURA) 4 MG tablet Take 4 mg by mouth daily.     levocetirizine (XYZAL)  5 MG tablet Take 5 mg by mouth every evening.     Loperamide HCl (IMODIUM PO) Take by mouth daily.     Multiple Vitamin (MULTIVITAMIN WITH MINERALS) TABS tablet Take 1 tablet by mouth daily.     mupirocin ointment (BACTROBAN) 2 % Apply 1 application topically daily as needed (for skin).   2   pregabalin (LYRICA) 75 MG capsule Take 150 mg by mouth 2 (two) times daily.      pregabalin (LYRICA) 75 MG capsule take 2 capsules     Probiotic Product (PROBIOTIC DAILY) CAPS Take 1 capsule by mouth daily.     SF 5000 PLUS 1.1 % CREA dental cream Place 1 application onto teeth at bedtime.   4   Sodium Fluoride (CLINPRO 5000) 1.1 % PSTE See admin instructions.     tretinoin (RETIN-A)  0.05 % cream See admin instructions.     tretinoin (RETIN-A) 0.1 % cream Apply 1 application topically at bedtime.     triamcinolone cream (KENALOG) 0.1 % See admin instructions.     triamcinolone ointment (KENALOG) 0.1 % APPLY A SMALL DAB TO FINGER AND PLACE IN EXTERNAL EAR CANALS FOR ITCHING, TWICE DAILY PRN.     UNABLE TO FIND Take by mouth.     Vitamins/Minerals TABS Take by mouth.     XARELTO 15 MG TABS tablet      No current facility-administered medications for this visit.    Allergies as of 09/20/2021 - Review Complete 07/17/2021  Allergen Reaction Noted   Macrodantin Rash 01/30/2012   Sulfa antibiotics Rash 01/30/2012    Family History  Problem Relation Age of Onset   Colon polyps Father    Parkinson's disease Father    Alzheimer's disease Father    Hypertension Mother    Heart disease Mother     Social History   Socioeconomic History   Marital status: Single    Spouse name: Not on file   Number of children: Not on file   Years of education: Not on file   Highest education level: Not on file  Occupational History   Not on file  Tobacco Use   Smoking status: Never   Smokeless tobacco: Never  Vaping Use   Vaping Use: Never used  Substance and Sexual Activity   Alcohol use: Yes    Alcohol/week: 0.0 standard drinks    Comment: occasional   Drug use: No   Sexual activity: Not Currently  Other Topics Concern   Not on file  Social History Narrative   Not on file   Social Determinants of Health   Financial Resource Strain: Not on file  Food Insecurity: Not on file  Transportation Needs: Not on file  Physical Activity: Not on file  Stress: Not on file  Social Connections: Not on file  Intimate Partner Violence: Not on file   Physical Exam: BP 102/62  Constitutional: generally well-appearing Psychiatric: alert and oriented x3 Abdomen: soft, nontender, somewhat distended, tympanic, nontender no obvious ascites, no peritoneal signs, normal bowel  sounds No peripheral edema noted in lower extremities   Assessment and plan: 60 y.o. male with quadriplegia, minor intermittent rectal bleeding in the setting of blood thinner  It has been 6 and half years since his last colonoscopy.  I do think that his minor intermittent rectal bleeding is benign in origin, likely hemorrhoidal.  Likely every other day digital stimulation for his bowel regimen contributes as does his chronic blood thinner use.  We discussed repeating colonoscopy sometime in  the near future to make sure nothing else is going on, he would like to do that sometime in March or April.  That would require hospital admission and to get a hold of his Xarelto which we can figure out as the time gets closer.  Please see the "Patient Instructions" section for addition details about the plan.  Gary Loffler, MD [facility] 09/20/2021, 2:13 PM   Total time on date of encounter was 30 minutes (this included time spent preparing to see the patient reviewing records; obtaining and/or reviewing separately obtained history; performing a medically appropriate exam and/or evaluation; counseling and educating the patient and family if present; ordering medications, tests or procedures if applicable; and documenting clinical information in the health record).

## 2021-11-29 ENCOUNTER — Ambulatory Visit (INDEPENDENT_AMBULATORY_CARE_PROVIDER_SITE_OTHER): Payer: Medicare Other | Admitting: Podiatry

## 2021-11-29 ENCOUNTER — Encounter: Payer: Self-pay | Admitting: Podiatry

## 2021-11-29 ENCOUNTER — Other Ambulatory Visit: Payer: Self-pay

## 2021-11-29 DIAGNOSIS — B351 Tinea unguium: Secondary | ICD-10-CM

## 2021-11-29 DIAGNOSIS — R600 Localized edema: Secondary | ICD-10-CM

## 2021-11-29 DIAGNOSIS — G6289 Other specified polyneuropathies: Secondary | ICD-10-CM | POA: Diagnosis not present

## 2021-11-29 NOTE — Progress Notes (Signed)
This patient returns to my office for at risk foot care.  This patient requires this care by a professional since this patient will be at risk due to having due to coagulation defect due to xarelto.  Patient is quadraplegia  and in a wheelchair.This patient is unable to cut nails himself since the patient cannot reach his nails.These nails are painful walking and wearing shoes. This patient presents for at risk foot care today.  General Appearance  Alert, conversant and in no acute stress.  Vascular  Dorsalis pedis and posterior tibial  pulses are palpable  bilaterally.  Capillary return is within normal limits  bilaterally. Temperature is within normal limits  bilaterally.  Neurologic  Senn-Weinstein monofilament wire test within normal limits  bilaterally. Muscle power within normal limits bilaterally.  Nails Thick disfigured discolored nails with subungual debris  from hallux to fifth toes bilaterally. No evidence of bacterial infection or drainage bilaterally. Avulsed fifth toenail left foot.  Orthopedic  No limitations of motion  feet .  No crepitus or effusions noted.  No bony pathology or digital deformities noted.  Skin  normotropic skin with no porokeratosis noted bilaterally.  No signs of infections or ulcers noted.      Onychomycosis  Pain in right toes  Pain in left toes  Consent was obtained for treatment procedures.   Mechanical debridement of nails 1-5  bilaterally performed with a nail nipper.  Filed with dremel without incident.    Return office visit   9 weeks                   Told patient to return for periodic foot care and evaluation due to potential at risk complications.   Gardiner Barefoot DPM

## 2022-01-18 ENCOUNTER — Encounter: Payer: Self-pay | Admitting: Gastroenterology

## 2022-02-01 ENCOUNTER — Telehealth: Payer: Self-pay

## 2022-02-01 NOTE — Telephone Encounter (Signed)
Patient due for colonoscopy in March/April 2023 with Dr Ardis Hughs at [facility].  I left message for patient to return call to further discuss.  Will continue efforts. ?

## 2022-02-02 NOTE — Telephone Encounter (Signed)
I reminded patient of my name, and he ended the call.  ?

## 2022-02-02 NOTE — Telephone Encounter (Signed)
Returned call to patient who stated he had "been meaning to return my call."  I proceeded to explain to him that Dr Ardis Hughs had wanted him to have a colonoscopy in March or April of this year at the hospital, and the patient interrupted me and said, "can I call you back next week.  I have things going on, and I will try to reach out to you next week."   ? ? ?

## 2022-02-06 NOTE — Telephone Encounter (Signed)
Patient returned call. Would like to discuss the possibility of having colonoscopy. Request a call after 3  ?

## 2022-02-06 NOTE — Telephone Encounter (Signed)
Returned call to patient to schedule colonoscopy at [facility].  Patient was hesitant to schedule.  He stated that he is no longer having rectal bleeding, but will occasionally have abdominal pain.  I offered patient a follow up appointment to further discuss with Dr Ardis Hughs, but he refused.  ? ?Patient stated that he is also concerned about the cost of admission and colonoscopy.  He stated that his last procedure was covered by insurance but the admission was not.  I explained to the patient that I was not sure of how the billing/coding at the hospital worked and he would need to call the billing department at [facility] to discuss.   ? ?Patient agreed to plan and verbalized understanding.  Patient stated he would contact me when he was ready to schedule.  No further questions.  ?

## 2022-02-07 ENCOUNTER — Encounter: Payer: Self-pay | Admitting: Podiatry

## 2022-02-07 ENCOUNTER — Other Ambulatory Visit: Payer: Self-pay

## 2022-02-07 ENCOUNTER — Ambulatory Visit (INDEPENDENT_AMBULATORY_CARE_PROVIDER_SITE_OTHER): Payer: Medicare Other | Admitting: Podiatry

## 2022-02-07 DIAGNOSIS — G6289 Other specified polyneuropathies: Secondary | ICD-10-CM

## 2022-02-07 DIAGNOSIS — R0981 Nasal congestion: Secondary | ICD-10-CM | POA: Insufficient documentation

## 2022-02-07 DIAGNOSIS — H33019 Retinal detachment with single break, unspecified eye: Secondary | ICD-10-CM | POA: Insufficient documentation

## 2022-02-07 DIAGNOSIS — M79674 Pain in right toe(s): Secondary | ICD-10-CM

## 2022-02-07 DIAGNOSIS — M79675 Pain in left toe(s): Secondary | ICD-10-CM

## 2022-02-07 DIAGNOSIS — B351 Tinea unguium: Secondary | ICD-10-CM

## 2022-02-07 DIAGNOSIS — L219 Seborrheic dermatitis, unspecified: Secondary | ICD-10-CM | POA: Insufficient documentation

## 2022-02-07 DIAGNOSIS — F458 Other somatoform disorders: Secondary | ICD-10-CM | POA: Insufficient documentation

## 2022-02-07 NOTE — Progress Notes (Signed)
This patient returns to my office for at risk foot care.  This patient requires this care by a professional since this patient will be at risk due to having due to coagulation defect due to xarelto.  Patient is quadraplegia  and in a wheelchair.This patient is unable to cut nails himself since the patient cannot reach his nails.These nails are painful walking and wearing shoes. This patient presents for at risk foot care today.  General Appearance  Alert, conversant and in no acute stress.  Vascular  Dorsalis pedis and posterior tibial  pulses are palpable  bilaterally.  Capillary return is within normal limits  bilaterally. Temperature is within normal limits  bilaterally.  Neurologic  Senn-Weinstein monofilament wire test within normal limits  bilaterally. Muscle power within normal limits bilaterally.  Nails Thick disfigured discolored nails with subungual debris  from hallux to fifth toes bilaterally. No evidence of bacterial infection or drainage bilaterally.   Orthopedic  No limitations of motion  feet .  No crepitus or effusions noted.  No bony pathology or digital deformities noted.  Skin  normotropic skin with no porokeratosis noted bilaterally.  No signs of infections or ulcers noted.      Onychomycosis  Pain in right toes  Pain in left toes  Consent was obtained for treatment procedures.   Mechanical debridement of nails 1-5  bilaterally performed with a nail nipper.  Filed with dremel without incident.    Return office visit   9 weeks                   Told patient to return for periodic foot care and evaluation due to potential at risk complications.   Grove Defina DPM  

## 2022-04-13 ENCOUNTER — Ambulatory Visit: Payer: Medicare Other | Admitting: Podiatry

## 2022-04-18 ENCOUNTER — Ambulatory Visit (INDEPENDENT_AMBULATORY_CARE_PROVIDER_SITE_OTHER): Payer: Medicare Other | Admitting: Podiatry

## 2022-04-18 ENCOUNTER — Encounter: Payer: Self-pay | Admitting: Podiatry

## 2022-04-18 DIAGNOSIS — B351 Tinea unguium: Secondary | ICD-10-CM | POA: Diagnosis not present

## 2022-04-18 DIAGNOSIS — G6289 Other specified polyneuropathies: Secondary | ICD-10-CM | POA: Diagnosis not present

## 2022-04-18 NOTE — Progress Notes (Signed)
This patient returns to my office for at risk foot care.  This patient requires this care by a professional since this patient will be at risk due to having due to coagulation defect due to xarelto.  Patient is quadraplegia  and in a wheelchair.This patient is unable to cut nails himself since the patient cannot reach his nails.These nails are painful walking and wearing shoes. This patient presents for at risk foot care today.  General Appearance  Alert, conversant and in no acute stress.  Vascular  Dorsalis pedis and posterior tibial  pulses are palpable  bilaterally.  Capillary return is within normal limits  bilaterally. Temperature is within normal limits  bilaterally.  Neurologic  Senn-Weinstein monofilament wire test within normal limits  bilaterally. Muscle power within normal limits bilaterally.  Nails Thick disfigured discolored nails with subungual debris  from hallux to fifth toes bilaterally. No evidence of bacterial infection or drainage bilaterally.   Orthopedic  No limitations of motion  feet .  No crepitus or effusions noted.  No bony pathology or digital deformities noted.  Skin  normotropic skin with no porokeratosis noted bilaterally.  No signs of infections or ulcers noted.      Onychomycosis  Pain in right toes  Pain in left toes  Consent was obtained for treatment procedures.   Mechanical debridement of nails 1-5  bilaterally performed with a nail nipper.  Filed with dremel without incident.    Return office visit   9 weeks                   Told patient to return for periodic foot care and evaluation due to potential at risk complications.   Daimion Adamcik DPM  

## 2022-05-16 ENCOUNTER — Ambulatory Visit: Payer: Medicare Other | Attending: Internal Medicine

## 2022-05-16 DIAGNOSIS — R42 Dizziness and giddiness: Secondary | ICD-10-CM | POA: Diagnosis present

## 2022-05-16 NOTE — Therapy (Signed)
OUTPATIENT PHYSICAL THERAPY VESTIBULAR EVALUATION     Patient Name: Gary Burch MRN: [id number] DOB:[birth year], 61 y.o., male Today's Date: 05/16/2022  PCP: Sueanne Margarita, DO  REFERRING PROVIDER: Sueanne Margarita, DO    PT End of Session - 05/16/22 1232     Visit Number 1    Number of Visits 6    Date for PT Re-Evaluation 06/27/22    Authorization Type Medicare    Progress Note Due on Visit 10    PT Start Time 1240    PT Stop Time 1345    PT Time Calculation (min) 65 min    Equipment Utilized During Treatment Gait belt    Activity Tolerance Patient tolerated treatment well    Behavior During Therapy WFL for tasks assessed/performed             Past Medical History:  Diagnosis Date   Cataracts, bilateral    Cellulitis 01-2013   left leg   DVT (deep venous thrombosis) (HCC)    Elevated liver enzymes    " once, as a response to medication"   Heart murmur    Kidney stone    Pneumonia    Retinal tear    bilateral   Spine injury    UTI (lower urinary tract infection)    Past Surgical History:  Procedure Laterality Date   COLONOSCOPY WITH PROPOFOL N/A 03/23/2015   Procedure: COLONOSCOPY WITH PROPOFOL;  Surgeon: Milus Banister, MD;  Location: [facility];  Service: Endoscopy;  Laterality: N/A;   EYE SURGERY     bilateral cataract   GAS/FLUID EXCHANGE Right 11/10/2015   Procedure: GAS/FLUID EXCHANGE- C3F8 10% used;  Surgeon: Hurman Horn, MD;  Location: [facility];  Service: Ophthalmology;  Laterality: Right;   PARS PLANA VITRECTOMY Right 11/10/2015   Procedure: PARS PLANA VITRECTOMY WITH 25 GAUGE with endolaser ;  Surgeon: Hurman Horn, MD;  Location: [facility];  Service: Ophthalmology;  Laterality: Right;   RETINAL DETACHMENT SURGERY  09/20/2015   RETINAL TEAR REPAIR CRYOTHERAPY     SPINE SURGERY  11/20/1979   fusion   TONSILLECTOMY     Patient Active Problem List   Diagnosis Date Noted   Bruxism 02/07/2022   Partial recent retinal detachment with single  defect 02/07/2022   Seborrheic dermatitis of scalp 02/07/2022   Sinus congestion 02/07/2022   Constipation 05/03/2021   Edema 05/03/2021   Pain in left leg 05/03/2021   Melena 05/19/2020   Non-pressure chronic ulcer of other part of unspecified foot with unspecified severity ([place]) 05/19/2020   Other specified disorders of nose and nasal sinuses 05/19/2020   Personal history of diseases of the blood and blood-forming organs and certain disorders involving the immune mechanism 05/19/2020   Reduced mobility 05/19/2020   Thrombophilia ([place]) 05/19/2020   Encounter for general adult medical examination without abnormal findings 05/17/2020   Acquired deformity of pinna 01/20/2020   Abnormal levels of other serum enzymes 11/25/2019   Flatulence, eructation and gas pain 11/25/2019   History of malignant neoplasm of skin 11/25/2019   History of thromboembolism of vein 11/25/2019   History of urinary stone 11/25/2019   Acute swimmer's ear of right side 10/18/2017   Onychomycosis 10/19/2015   Quadriplegia ([place]) 03/22/2015   Neurogenic bladder 02/09/2013   Neurogenic bowel 02/09/2013   C6 spinal cord injury ([place]) 02/06/2012   Injury of ulnar nerve at right forearm level 02/06/2012   Rotator cuff syndrome of right shoulder 02/06/2012   Right lateral epicondylitis 02/06/2012  ONSET DATE: couple months ago  REFERRING DIAG: R42 (ICD-10-CM) - Dizziness and giddiness   THERAPY DIAG:  Dizziness and giddiness - Plan: PT plan of care cert/re-cert  Rationale for Evaluation and Treatment Rehabilitation  SUBJECTIVE:   SUBJECTIVE STATEMENT: Some feeling of vertigo over the past few months.  Notes sleeping on right side triggers the symptoms and had been using Meclazine which helped alleviate.  Notes that the rolling to right continues to trigger these symptoms. Notes that symptoms last 5-15 sec and notes that symptoms also occur with sitting to side of bed Pt accompanied by: self  PERTINENT  HISTORY: C6 SCI and uses hospital bed with trapeze bar and Beazy board transfers with assistance. After initial injury (40+ years) had cervical fusion at injury site   PAIN:  Are you having pain? No typical level of nerve pain  PRECAUTIONS: Other: pressure relief   WEIGHT BEARING RESTRICTIONS No  FALLS: Has patient fallen in last 6 months? No  LIVING ENVIRONMENT: Lives with: lives with their family Lives in: House/apartment Stairs: No Has following equipment at home: Wheelchair (power)  PLOF: Needs assistance with ADLs and Needs assistance with transfers  PATIENT GOALS get rid of dizziness   OBJECTIVE:   DIAGNOSTIC FINDINGS: none at this time  COGNITION: Overall cognitive status: Within functional limits for tasks assessed   SENSATION: Not tested C6 quadriplegic    MUSCLE TONE: spasticity LLE > RLE     Cervical ROM:    Active A/PROM (deg) eval  Flexion WFL  Extension guarded  Right lateral flexion guarded  Left lateral flexion Guarded  Right rotation 30-40   Left rotation 30-40  (Blank rows = not tested) Guarded to movements of c-spine due to anxiety STRENGTH:    (Blank rows = not tested)  BED MOBILITY:  Rolling to Right Min A Rolling to Left Min A  TRANSFERS: Lateral scoot-pivot mod-max A    PATIENT SURVEYS:  FOTO to be completed   VESTIBULAR ASSESSMENT   GENERAL OBSERVATION: wears bifocals    SYMPTOM BEHAVIOR:   Subjective history: several months of dizziness with rolling in bed and supine to sit   Non-Vestibular symptoms:  none associated, notes new tx for glaucoma using eye drops and this was also around the time symptoms began   Type of dizziness: Spinning/Vertigo   Frequency: when rolling/laying on right   Duration: 15-30 sec   Aggravating factors: Induced by position change: rolling to the right and supine to sit   Relieving factors:  laying on left   Progression of symptoms: better, varies   OCULOMOTOR EXAM:   Ocular  Alignment: normal   Ocular ROM: No Limitations   Spontaneous Nystagmus: absent   Gaze-Induced Nystagmus: absent   Smooth Pursuits: intact   Saccades: intact   Convergence/Divergence: 3 cm       VESTIBULAR - OCULAR REFLEX:    Slow VOR: Normal   VOR Cancellation: Normal   Head-Impulse Test: HIT Right: positive HIT Left: negative   Dynamic Visual Acuity: Not able to be assessed    POSITIONAL TESTING: Right Roll Test: no nystagmus Left Roll Test: no nystagmus Right Sidelying: no nystagmus    MOTION SENSITIVITY:    Motion Sensitivity Quotient  Intensity: 0 = none, 1 = Lightheaded, 2 = Mild, 3 = Moderate, 4 = Severe, 5 = Vomiting  Intensity  1. Sitting to supine   2. Supine to L side   3. Supine to R side   4. Supine to sitting 0  5.  L Hallpike-Dix   6. Up from L    7. R Hallpike-Dix   8. Up from R    9. Sitting, head  tipped to L knee   10. Head up from L  knee   11. Sitting, head  tipped to R knee   12. Head up from R  knee   13. Sitting head turns x5   14.Sitting head nods x5   15. In stance, 180  turn to L    16. In stance, 180  turn to R     OTHOSTATICS: not done  FUNCTIONAL GAIT:  N/A, uses power chair exclusively   VESTIBULAR TREATMENT:  Canalith Repositioning:   Comment: not indicated at this time, did not produce positional symptoms at this time . Pt educated on technique for self-Epley right based on report of symptoms Gaze Adaptation:   To be determined Habituation:   To be determined Other:   PATIENT EDUCATION: Education details: self-Epley Person educated: Patient Education method: Explanation, Demonstration, and Handouts Education comprehension: verbalized understanding and needs further education   GOALS: Goals reviewed with patient? Yes  SHORT TERM GOALS: Target date: 06/06/2022   The patient will be independent with HEP for gaze adaptation, habituation, balance, and general mobility. Baseline: Goal status: INITIAL    LONG  TERM GOALS: Target date: 06/27/2022    Patient to be free of vertigo symptoms to improve safety with bed mobility and sitting EOB to reduce risk for falls Baseline:  Goal status: INITIAL    ASSESSMENT:  CLINICAL IMPRESSION: Patient is a 61 y.o. gentleman who was seen today for physical therapy evaluation and treatment for dizziness and report of positional vertigo.  Pt reports hx of positional dizziness/vertigo over the past few months which causes discomfort when lying in bed and dizziness/imbalance when transitioning to sitting EOB in preparation for slideboard transfers with caregiver.  Unable to evoke symptoms during assessment today but pt also very guarded and limited to requisite positions and head movements due to quadriplegia and fear/anxiety regarding the needed head/neck positions.  Patient would benefit from future sessions to provide ongoing assessment and treatment to address this positional vertigo to improve comfort and safety with mobility to reduce imbalance when sitting to EOB to enhance safety with slide board transfers    OBJECTIVE IMPAIRMENTS decreased balance and dizziness.   ACTIVITY LIMITATIONS sitting  PARTICIPATION LIMITATIONS:  safety with transfers  PERSONAL FACTORS 1 comorbidity: quadriplegia  are also affecting patient's functional outcome.   REHAB POTENTIAL: Good  CLINICAL DECISION MAKING: Evolving/moderate complexity  EVALUATION COMPLEXITY: Moderate   PLAN: PT FREQUENCY: 1-2x/week  PT DURATION: 6 weeks  PLANNED INTERVENTIONS: Therapeutic exercises, Therapeutic activity, Neuromuscular re-education, Balance training, Gait training, Patient/Family education, Joint mobilization, Vestibular training, Canalith repositioning, DME instructions, Spinal mobilization, and Manual therapy  PLAN FOR NEXT SESSION: FOTO, re-assess for positional vertigo as able   Toniann Fail, PT 05/16/2022, 2:00 PM

## 2022-05-21 ENCOUNTER — Ambulatory Visit: Payer: Medicare Other | Attending: Internal Medicine

## 2022-05-21 DIAGNOSIS — R42 Dizziness and giddiness: Secondary | ICD-10-CM | POA: Insufficient documentation

## 2022-05-21 NOTE — Therapy (Signed)
OUTPATIENT PHYSICAL THERAPY TREATMENT NOTE   Patient Name: Gary Burch MRN: [id number] DOB:23-Oct-1961, 61 y.o., male Today's Date: 05/21/2022  PCP: Sueanne Margarita, DO  REFERRING PROVIDER: Sueanne Margarita, DO   END OF SESSION:   PT End of Session - 05/21/22 1621     Visit Number 2    Number of Visits 6    Date for PT Re-Evaluation 06/27/22    Authorization Type Medicare    Progress Note Due on Visit 10    PT Start Time 1620    PT Stop Time 1700    PT Time Calculation (min) 40 min    Equipment Utilized During Treatment Gait belt    Activity Tolerance Patient tolerated treatment well    Behavior During Therapy WFL for tasks assessed/performed             Past Medical History:  Diagnosis Date   Cataracts, bilateral    Cellulitis 01-2013   left leg   DVT (deep venous thrombosis) (HCC)    Elevated liver enzymes    " once, as a response to medication"   Heart murmur    Kidney stone    Pneumonia    Retinal tear    bilateral   Spine injury    UTI (lower urinary tract infection)    Past Surgical History:  Procedure Laterality Date   COLONOSCOPY WITH PROPOFOL N/A 03/23/2015   Procedure: COLONOSCOPY WITH PROPOFOL;  Surgeon: Milus Banister, MD;  [facility]: [facility];  Service: Endoscopy;  Laterality: N/A;   EYE SURGERY     bilateral cataract   GAS/FLUID EXCHANGE Right 11/10/2015   Procedure: GAS/FLUID EXCHANGE- C3F8 10% used;  Surgeon: Hurman Horn, MD;  Location: [facility];  Service: Ophthalmology;  Laterality: Right;   PARS PLANA VITRECTOMY Right 11/10/2015   Procedure: PARS PLANA VITRECTOMY WITH 25 GAUGE with endolaser ;  Surgeon: Hurman Horn, MD;  Location: [facility];  Service: Ophthalmology;  Laterality: Right;   RETINAL DETACHMENT SURGERY  09/20/2015   RETINAL TEAR REPAIR CRYOTHERAPY     SPINE SURGERY  11/20/1979   fusion   TONSILLECTOMY     Patient Active Problem List   Diagnosis Date Noted   Bruxism 02/07/2022   Partial recent retinal detachment with single  defect 02/07/2022   Seborrheic dermatitis of scalp 02/07/2022   Sinus congestion 02/07/2022   Constipation 05/03/2021   Edema 05/03/2021   Pain in left leg 05/03/2021   Melena 05/19/2020   Non-pressure chronic ulcer of other part of unspecified foot with unspecified severity ([place]) 05/19/2020   Other specified disorders of nose and nasal sinuses 05/19/2020   Personal history of diseases of the blood and blood-forming organs and certain disorders involving the immune mechanism 05/19/2020   Reduced mobility 05/19/2020   Thrombophilia ([place]) 05/19/2020   Encounter for general adult medical examination without abnormal findings 05/17/2020   Acquired deformity of pinna 01/20/2020   Abnormal levels of other serum enzymes 11/25/2019   Flatulence, eructation and gas pain 11/25/2019   History of malignant neoplasm of skin 11/25/2019   History of thromboembolism of vein 11/25/2019   History of urinary stone 11/25/2019   Acute swimmer's ear of right side 10/18/2017   Onychomycosis 10/19/2015   Quadriplegia ([place]) 03/22/2015   Neurogenic bladder 02/09/2013   Neurogenic bowel 02/09/2013   C6 spinal cord injury ([place]) 02/06/2012   Injury of ulnar nerve at right forearm level 02/06/2012   Rotator cuff syndrome of right shoulder 02/06/2012   Right lateral epicondylitis  02/06/2012    REFERRING DIAG: R42 (ICD-10-CM) - Dizziness and giddiness   THERAPY DIAG:  Dizziness and giddiness  Rationale for Evaluation and Treatment Rehabilitation  PERTINENT HISTORY: mentions work up in the past by Dr. Nyoka Cowden and "couldn't find anything". only happens when laying on his left side so encouraged him to change positions and I suspect it would help. no other red flag signs. urgent and emergent precautions were given. If persists said I would bring him in for an EKG. Look for triggers. He usually likes to sleep on his right side but has been giving him vertigo.   PRECAUTIONS: hx of C6 quadriplegia  SUBJECTIVE: "I  had been avoiding laying on the right and did it last night which activated the symptoms again"  PAIN:  Are you having pain? No   OBJECTIVE:    TODAY'S TREATMENT: 05/21/22 Activity Comments  Transfers mod-max A lateral scoot   Dix-Hallpike- Right Right upbeating nystagmus  Right Epley Maneuver x 3 Two-assist needed due to mobility impairments  Pt education in rolling right/left 5x for habituation as he may be able to perform this more safely/independently at home with caregiver           DIAGNOSTIC FINDINGS: none at this time   COGNITION: Overall cognitive status: Within functional limits for tasks assessed             SENSATION: Not tested C6 quadriplegic       MUSCLE TONE: spasticity LLE > RLE         Cervical ROM:     Active A/PROM (deg) eval  Flexion WFL  Extension guarded  Right lateral flexion guarded  Left lateral flexion Guarded  Right rotation 30-40   Left rotation 30-40  (Blank rows = not tested) Guarded to movements of c-spine due to anxiety STRENGTH:      (Blank rows = not tested)   BED MOBILITY:  Rolling to Right Min A Rolling to Left Min A   TRANSFERS: Lateral scoot-pivot mod-max A       PATIENT SURVEYS:  FOTO to be completed     VESTIBULAR ASSESSMENT              GENERAL OBSERVATION: wears bifocals                        SYMPTOM BEHAVIOR:                       Subjective history: several months of dizziness with rolling in bed and supine to sit                       Non-Vestibular symptoms:  none associated, notes new tx for glaucoma using eye drops and this was also around the time symptoms began                       Type of dizziness: Spinning/Vertigo                       Frequency: when rolling/laying on right                       Duration: 15-30 sec                       Aggravating factors: Induced by position change: rolling to the right and supine to sit  Relieving factors:  laying on left                        Progression of symptoms: better, varies              OCULOMOTOR EXAM:                       Ocular Alignment: normal                       Ocular ROM: No Limitations                       Spontaneous Nystagmus: absent                       Gaze-Induced Nystagmus: absent                       Smooth Pursuits: intact                       Saccades: intact                       Convergence/Divergence: 3 cm                             VESTIBULAR - OCULAR REFLEX:                        Slow VOR: Normal                       VOR Cancellation: Normal                       Head-Impulse Test: HIT Right: positive HIT Left: negative                       Dynamic Visual Acuity: Not able to be assessed                        POSITIONAL TESTING: Right Roll Test: no nystagmus Left Roll Test: no nystagmus Right Sidelying: no nystagmus               MOTION SENSITIVITY:                         Motion Sensitivity Quotient   Intensity: 0 = none, 1 = Lightheaded, 2 = Mild, 3 = Moderate, 4 = Severe, 5 = Vomiting   Intensity  1. Sitting to supine    2. Supine to L side    3. Supine to R side    4. Supine to sitting 0  5. L Hallpike-Dix    6. Up from L     7. R Hallpike-Dix    8. Up from R     9. Sitting, head  tipped to L knee    10. Head up from L  knee    11. Sitting, head  tipped to R knee    12. Head up from R  knee    13. Sitting head turns x5    14.Sitting head nods x5    15. In stance, 180  turn to L  16. In stance, 180  turn to R                OTHOSTATICS: not done   FUNCTIONAL GAIT:  N/A, uses power chair exclusively     VESTIBULAR TREATMENT:   Canalith Repositioning:                       Comment: not indicated at this time, did not produce positional symptoms at this time . Pt educated on technique for self-Epley right based on report of symptoms Gaze Adaptation:                       To be determined Habituation:                       To be  determined Other:    PATIENT EDUCATION: Education details: self-Epley Person educated: Patient Education method: Explanation, Demonstration, and Handouts Education comprehension: verbalized understanding and needs further education     GOALS: Goals reviewed with patient? Yes   SHORT TERM GOALS: Target date: 06/06/2022    The patient will be independent with HEP for gaze adaptation, habituation, balance, and general mobility. Baseline: Goal status: INITIAL       LONG TERM GOALS: Target date: 06/27/2022     Patient to be free of vertigo symptoms to improve safety with bed mobility and sitting EOB to reduce risk for falls Baseline:  Goal status: INITIAL       ASSESSMENT:   CLINICAL IMPRESSION: Able to successfully elicit nystagmus with right Dix-Hallpike revealing prominent right, upbeating nystagmus without much latency noted and fatigue of 10-17 sec with report of cessation of symptoms. Proceeded with Epley maneuver right and performed Dix-Hallpike to right Epley maneuver for 3 rounds with same effect albeit less intensity of nystagmus observed.  Discussed with pt use of rolling for habituation and techniuqe to complete this with caregiver for positioning. Will continue at next session with re-assessment and positional symptoms     OBJECTIVE IMPAIRMENTS decreased balance and dizziness.    ACTIVITY LIMITATIONS sitting   PARTICIPATION LIMITATIONS:  safety with transfers   PERSONAL FACTORS 1 comorbidity: quadriplegia  are also affecting patient's functional outcome.    REHAB POTENTIAL: Good   CLINICAL DECISION MAKING: Evolving/moderate complexity   EVALUATION COMPLEXITY: Moderate     PLAN: PT FREQUENCY: 1-2x/week   PT DURATION: 6 weeks   PLANNED INTERVENTIONS: Therapeutic exercises, Therapeutic activity, Neuromuscular re-education, Balance training, Gait training, Patient/Family education, Joint mobilization, Vestibular training, Canalith repositioning, DME  instructions, Spinal mobilization, and Manual therapy   PLAN FOR NEXT SESSION: FOTO, re-assess for positional vertigo as able    5:29 PM, 05/21/22 M. Sherlyn Lees, PT, DPT Physical Therapist- [place] Office Number: [phone]

## 2022-05-29 ENCOUNTER — Ambulatory Visit: Payer: Medicare Other

## 2022-05-29 DIAGNOSIS — R42 Dizziness and giddiness: Secondary | ICD-10-CM | POA: Diagnosis not present

## 2022-05-29 NOTE — Therapy (Signed)
OUTPATIENT PHYSICAL THERAPY TREATMENT NOTE   Patient Name: Gary Burch MRN: [id number] DOB:06/29/1961, 61 y.o., male Today's Date: 05/29/2022  PCP: Sueanne Margarita, DO  REFERRING PROVIDER: Sueanne Margarita, DO   END OF SESSION:   PT End of Session - 05/29/22 1316     Visit Number 3    Number of Visits 6    Date for PT Re-Evaluation 06/27/22    Authorization Type Medicare    Progress Note Due on Visit 10    PT Start Time 1315    PT Stop Time 1400    PT Time Calculation (min) 45 min    Equipment Utilized During Treatment Gait belt    Activity Tolerance Patient tolerated treatment well    Behavior During Therapy WFL for tasks assessed/performed             Past Medical History:  Diagnosis Date   Cataracts, bilateral    Cellulitis 01-2013   left leg   DVT (deep venous thrombosis) (HCC)    Elevated liver enzymes    " once, as a response to medication"   Heart murmur    Kidney stone    Pneumonia    Retinal tear    bilateral   Spine injury    UTI (lower urinary tract infection)    Past Surgical History:  Procedure Laterality Date   COLONOSCOPY WITH PROPOFOL N/A 03/23/2015   Procedure: COLONOSCOPY WITH PROPOFOL;  Surgeon: Milus Banister, MD;  Location: [facility];  Service: Endoscopy;  Laterality: N/A;   EYE SURGERY     bilateral cataract   GAS/FLUID EXCHANGE Right 11/10/2015   Procedure: GAS/FLUID EXCHANGE- C3F8 10% used;  Surgeon: Hurman Horn, MD;  Location: [facility];  Service: Ophthalmology;  Laterality: Right;   PARS PLANA VITRECTOMY Right 11/10/2015   Procedure: PARS PLANA VITRECTOMY WITH 25 GAUGE with endolaser ;  Surgeon: Hurman Horn, MD;  Location: [facility];  Service: Ophthalmology;  Laterality: Right;   RETINAL DETACHMENT SURGERY  09/20/2015   RETINAL TEAR REPAIR CRYOTHERAPY     SPINE SURGERY  11/20/1979   fusion   TONSILLECTOMY     Patient Active Problem List   Diagnosis Date Noted   Bruxism 02/07/2022   Partial recent retinal detachment with  single defect 02/07/2022   Seborrheic dermatitis of scalp 02/07/2022   Sinus congestion 02/07/2022   Constipation 05/03/2021   Edema 05/03/2021   Pain in left leg 05/03/2021   Melena 05/19/2020   Non-pressure chronic ulcer of other part of unspecified foot with unspecified severity ([place]) 05/19/2020   Other specified disorders of nose and nasal sinuses 05/19/2020   Personal history of diseases of the blood and blood-forming organs and certain disorders involving the immune mechanism 05/19/2020   Reduced mobility 05/19/2020   Thrombophilia ([place]) 05/19/2020   Encounter for general adult medical examination without abnormal findings 05/17/2020   Acquired deformity of pinna 01/20/2020   Abnormal levels of other serum enzymes 11/25/2019   Flatulence, eructation and gas pain 11/25/2019   History of malignant neoplasm of skin 11/25/2019   History of thromboembolism of vein 11/25/2019   History of urinary stone 11/25/2019   Acute swimmer's ear of right side 10/18/2017   Onychomycosis 10/19/2015   Quadriplegia ([place]) 03/22/2015   Neurogenic bladder 02/09/2013   Neurogenic bowel 02/09/2013   C6 spinal cord injury ([place]) 02/06/2012   Injury of ulnar nerve at right forearm level 02/06/2012   Rotator cuff syndrome of right shoulder 02/06/2012   Right lateral epicondylitis  02/06/2012    REFERRING DIAG: R42 (ICD-10-CM) - Dizziness and giddiness   THERAPY DIAG:  Dizziness and giddiness  Rationale for Evaluation and Treatment Rehabilitation  PERTINENT HISTORY: mentions work up in the past by Dr. Nyoka Cowden and "couldn't find anything". only happens when laying on his left side so encouraged him to change positions and I suspect it would help. no other red flag signs. urgent and emergent precautions were given. If persists said I would bring him in for an EKG. Look for triggers. He usually likes to sleep on his right side but has been giving him vertigo.   PRECAUTIONS: hx of C6  quadriplegia  SUBJECTIVE: "It feels like it was doing better for several days, less intense with the symptoms but notice when transitioning from sitting and rolling."  PAIN:  Are you having pain? No   OBJECTIVE:   TODAY'S TREATMENT: 05/29/22 Activity Comments  Dix Hallpike right x 2 No nystagmus, no report of symptoms  Dix-Hallpike x 1 No nystagmus, no report of symptoms  Transfer EOM-power chair x 2 Mod-max A  VOR x 1: horizontal/vertical X 30 sec  Head Impulse Test Attempted, but too guarded to perform  Education regarding vestibular rehab: e.g. positional tx, habituation, gaze stabilization Positioning > habituation > gaze stabilization        DIAGNOSTIC FINDINGS: none at this time   COGNITION: Overall cognitive status: Within functional limits for tasks assessed             SENSATION: Not tested C6 quadriplegic       MUSCLE TONE: spasticity LLE > RLE         Cervical ROM:     Active A/PROM (deg) eval  Flexion WFL  Extension guarded  Right lateral flexion guarded  Left lateral flexion Guarded  Right rotation 30-40   Left rotation 30-40  (Blank rows = not tested) Guarded to movements of c-spine due to anxiety STRENGTH:      (Blank rows = not tested)   BED MOBILITY:  Rolling to Right Min A Rolling to Left Min A   TRANSFERS: Lateral scoot-pivot mod-max A       PATIENT SURVEYS:  FOTO to be completed     VESTIBULAR ASSESSMENT              GENERAL OBSERVATION: wears bifocals                        SYMPTOM BEHAVIOR:                       Subjective history: several months of dizziness with rolling in bed and supine to sit                       Non-Vestibular symptoms:  none associated, notes new tx for glaucoma using eye drops and this was also around the time symptoms began                       Type of dizziness: Spinning/Vertigo                       Frequency: when rolling/laying on right                       Duration: 15-30 sec  Aggravating factors: Induced by position change: rolling to the right and supine to sit                       Relieving factors:  laying on left                       Progression of symptoms: better, varies              OCULOMOTOR EXAM:                       Ocular Alignment: normal                       Ocular ROM: No Limitations                       Spontaneous Nystagmus: absent                       Gaze-Induced Nystagmus: absent                       Smooth Pursuits: intact                       Saccades: intact                       Convergence/Divergence: 3 cm                             VESTIBULAR - OCULAR REFLEX:                        Slow VOR: Normal                       VOR Cancellation: Normal                       Head-Impulse Test: HIT Right: positive HIT Left: negative                       Dynamic Visual Acuity: Not able to be assessed                        POSITIONAL TESTING: Right Roll Test: no nystagmus Left Roll Test: no nystagmus Right Sidelying: no nystagmus               MOTION SENSITIVITY:                         Motion Sensitivity Quotient   Intensity: 0 = none, 1 = Lightheaded, 2 = Mild, 3 = Moderate, 4 = Severe, 5 = Vomiting   Intensity  1. Sitting to supine    2. Supine to L side    3. Supine to R side    4. Supine to sitting 0  5. L Hallpike-Dix    6. Up from L     7. R Hallpike-Dix    8. Up from R     9. Sitting, head  tipped to L knee    10. Head up from L  knee    11. Sitting, head  tipped to R knee    12. Head  up from R  knee    13. Sitting head turns x5    14.Sitting head nods x5    15. In stance, 180  turn to L     16. In stance, 180  turn to R                OTHOSTATICS: not done   FUNCTIONAL GAIT:  N/A, uses power chair exclusively     VESTIBULAR TREATMENT:   Canalith Repositioning:                       Comment: not indicated at this time, did not produce positional symptoms at this time . Pt educated  on technique for self-Epley right based on report of symptoms Gaze Adaptation:                       To be determined Habituation:                       To be determined Other:    PATIENT EDUCATION: Education details: self-Epley Person educated: Patient Education method: Explanation, Demonstration, and Handouts Education comprehension: verbalized understanding and needs further education     GOALS: Goals reviewed with patient? Yes   SHORT TERM GOALS: Target date: 06/06/2022    The patient will be independent with HEP for gaze adaptation, habituation, balance, and general mobility. Baseline: Goal status: INITIAL       LONG TERM GOALS: Target date: 06/27/2022     Patient to be free of vertigo symptoms to improve safety with bed mobility and sitting EOB to reduce risk for falls Baseline:  Goal status: INITIAL       ASSESSMENT:   CLINICAL IMPRESSION: Pt reports feeling less intense symptoms and has been able to lie on right side in bed at home. Performed Dix-Hallpike right and left without any provocation.  Attempted Head Impulse Test but unable to perform due to pt guarding. Will f/u to assess if symptoms still present. Added VOR x 1 horizontal/vertical for HEP   OBJECTIVE IMPAIRMENTS decreased balance and dizziness.    ACTIVITY LIMITATIONS sitting   PARTICIPATION LIMITATIONS:  safety with transfers   PERSONAL FACTORS 1 comorbidity: quadriplegia  are also affecting patient's functional outcome.    REHAB POTENTIAL: Good   CLINICAL DECISION MAKING: Evolving/moderate complexity   EVALUATION COMPLEXITY: Moderate     PLAN: PT FREQUENCY: 1-2x/week   PT DURATION: 6 weeks   PLANNED INTERVENTIONS: Therapeutic exercises, Therapeutic activity, Neuromuscular re-education, Balance training, Gait training, Patient/Family education, Joint mobilization, Vestibular training, Canalith repositioning, DME instructions, Spinal mobilization, and Manual therapy   PLAN FOR NEXT SESSION:  FOTO, re-assess for positional vertigo as able    1:17 PM, 05/29/22 M. Sherlyn Lees, PT, DPT Physical Therapist- [place] Office Number: [phone]

## 2022-06-06 ENCOUNTER — Ambulatory Visit: Payer: Medicare Other

## 2022-06-06 DIAGNOSIS — R42 Dizziness and giddiness: Secondary | ICD-10-CM

## 2022-06-06 NOTE — Therapy (Signed)
OUTPATIENT PHYSICAL THERAPY TREATMENT NOTE   Patient Name: Gary Burch MRN: [id number] DOB:[birth year], 61 y.o., male Today's Date: 06/06/2022  PCP: Sueanne Margarita, DO  REFERRING PROVIDER: Sueanne Margarita, DO   END OF SESSION:   PT End of Session - 06/06/22 1300     Visit Number 4    Number of Visits 6    Date for PT Re-Evaluation 06/27/22    Authorization Type Medicare    Progress Note Due on Visit 10    PT Start Time 1315    PT Stop Time 1400    PT Time Calculation (min) 45 min    Equipment Utilized During Treatment Gait belt    Activity Tolerance Patient tolerated treatment well    Behavior During Therapy WFL for tasks assessed/performed             Past Medical History:  Diagnosis Date   Cataracts, bilateral    Cellulitis 01-2013   left leg   DVT (deep venous thrombosis) (HCC)    Elevated liver enzymes    " once, as a response to medication"   Heart murmur    Kidney stone    Pneumonia    Retinal tear    bilateral   Spine injury    UTI (lower urinary tract infection)    Past Surgical History:  Procedure Laterality Date   COLONOSCOPY WITH PROPOFOL N/A 03/23/2015   Procedure: COLONOSCOPY WITH PROPOFOL;  Surgeon: Milus Banister, MD;  Location: [facility];  Service: Endoscopy;  Laterality: N/A;   EYE SURGERY     bilateral cataract   GAS/FLUID EXCHANGE Right 11/10/2015   Procedure: GAS/FLUID EXCHANGE- C3F8 10% used;  Surgeon: Hurman Horn, MD;  Location: [facility];  Service: Ophthalmology;  Laterality: Right;   PARS PLANA VITRECTOMY Right 11/10/2015   Procedure: PARS PLANA VITRECTOMY WITH 25 GAUGE with endolaser ;  Surgeon: Hurman Horn, MD;  Location: [facility];  Service: Ophthalmology;  Laterality: Right;   RETINAL DETACHMENT SURGERY  09/20/2015   RETINAL TEAR REPAIR CRYOTHERAPY     SPINE SURGERY  11/20/1979   fusion   TONSILLECTOMY     Patient Active Problem List   Diagnosis Date Noted   Bruxism 02/07/2022   Partial recent retinal detachment with  single defect 02/07/2022   Seborrheic dermatitis of scalp 02/07/2022   Sinus congestion 02/07/2022   Constipation 05/03/2021   Edema 05/03/2021   Pain in left leg 05/03/2021   Melena 05/19/2020   Non-pressure chronic ulcer of other part of unspecified foot with unspecified severity ([place]) 05/19/2020   Other specified disorders of nose and nasal sinuses 05/19/2020   Personal history of diseases of the blood and blood-forming organs and certain disorders involving the immune mechanism 05/19/2020   Reduced mobility 05/19/2020   Thrombophilia ([place]) 05/19/2020   Encounter for general adult medical examination without abnormal findings 05/17/2020   Acquired deformity of pinna 01/20/2020   Abnormal levels of other serum enzymes 11/25/2019   Flatulence, eructation and gas pain 11/25/2019   History of malignant neoplasm of skin 11/25/2019   History of thromboembolism of vein 11/25/2019   History of urinary stone 11/25/2019   Acute swimmer's ear of right side 10/18/2017   Onychomycosis 10/19/2015   Quadriplegia ([place]) 03/22/2015   Neurogenic bladder 02/09/2013   Neurogenic bowel 02/09/2013   C6 spinal cord injury ([place]) 02/06/2012   Injury of ulnar nerve at right forearm level 02/06/2012   Rotator cuff syndrome of right shoulder 02/06/2012   Right lateral epicondylitis  02/06/2012    REFERRING DIAG: R42 (ICD-10-CM) - Dizziness and giddiness   THERAPY DIAG:  Dizziness and giddiness  Rationale for Evaluation and Treatment Rehabilitation  PERTINENT HISTORY: mentions work up in the past by Dr. Nyoka Cowden and "couldn't find anything". only happens when laying on his left side so encouraged him to change positions and I suspect it would help. no other red flag signs. urgent and emergent precautions were given. If persists said I would bring him in for an EKG. Look for triggers. He usually likes to sleep on his right side but has been giving him vertigo.   PRECAUTIONS: hx of C6  quadriplegia  SUBJECTIVE: "Has been able to lay on right side without much problem, but now noticing that when head is flexed forward and tilting back in w/c"  PAIN:  Are you having pain? No   OBJECTIVE:   TODAY'S TREATMENT: 06/06/22 Activity Comments  Transfer to EOM mod-max A lateral scoot   Dix-Hallpike Right Up-beating nystagmus right  Right Epley maneuver x 3 Slight latency, fatigue after 18-30 sec  Education regarding Brandt-Daroff               DIAGNOSTIC FINDINGS: none at this time   COGNITION: Overall cognitive status: Within functional limits for tasks assessed             SENSATION: Not tested C6 quadriplegic       MUSCLE TONE: spasticity LLE > RLE         Cervical ROM:     Active A/PROM (deg) eval  Flexion WFL  Extension guarded  Right lateral flexion guarded  Left lateral flexion Guarded  Right rotation 30-40   Left rotation 30-40  (Blank rows = not tested) Guarded to movements of c-spine due to anxiety STRENGTH:      (Blank rows = not tested)   BED MOBILITY:  Rolling to Right Min A Rolling to Left Min A   TRANSFERS: Lateral scoot-pivot mod-max A       PATIENT SURVEYS:  FOTO to be completed     VESTIBULAR ASSESSMENT              GENERAL OBSERVATION: wears bifocals                        SYMPTOM BEHAVIOR:                       Subjective history: several months of dizziness with rolling in bed and supine to sit                       Non-Vestibular symptoms:  none associated, notes new tx for glaucoma using eye drops and this was also around the time symptoms began                       Type of dizziness: Spinning/Vertigo                       Frequency: when rolling/laying on right                       Duration: 15-30 sec                       Aggravating factors: Induced by position change: rolling to the right and supine to sit  Relieving factors:  laying on left                       Progression of  symptoms: better, varies              OCULOMOTOR EXAM:                       Ocular Alignment: normal                       Ocular ROM: No Limitations                       Spontaneous Nystagmus: absent                       Gaze-Induced Nystagmus: absent                       Smooth Pursuits: intact                       Saccades: intact                       Convergence/Divergence: 3 cm                             VESTIBULAR - OCULAR REFLEX:                        Slow VOR: Normal                       VOR Cancellation: Normal                       Head-Impulse Test: HIT Right: positive HIT Left: negative                       Dynamic Visual Acuity: Not able to be assessed                        POSITIONAL TESTING: Right Roll Test: no nystagmus Left Roll Test: no nystagmus Right Sidelying: no nystagmus               MOTION SENSITIVITY:                         Motion Sensitivity Quotient   Intensity: 0 = none, 1 = Lightheaded, 2 = Mild, 3 = Moderate, 4 = Severe, 5 = Vomiting   Intensity  1. Sitting to supine    2. Supine to L side    3. Supine to R side    4. Supine to sitting 0  5. L Hallpike-Dix    6. Up from L     7. R Hallpike-Dix    8. Up from R     9. Sitting, head  tipped to L knee    10. Head up from L  knee    11. Sitting, head  tipped to R knee    12. Head up from R  knee    13. Sitting head turns x5    14.Sitting head nods x5    15. In stance, 180  turn to L  16. In stance, 180  turn to R                OTHOSTATICS: not done   FUNCTIONAL GAIT:  N/A, uses power chair exclusively     VESTIBULAR TREATMENT:   Canalith Repositioning:                       Comment: not indicated at this time, did not produce positional symptoms at this time . Pt educated on technique for self-Epley right based on report of symptoms Gaze Adaptation:                       To be determined Habituation:                       To be determined Other:     PATIENT EDUCATION: Education details: self-Epley Person educated: Patient Education method: Explanation, Demonstration, and Handouts Education comprehension: verbalized understanding and needs further education     GOALS: Goals reviewed with patient? Yes   SHORT TERM GOALS: Target date: 06/06/2022    The patient will be independent with HEP for gaze adaptation, habituation, balance, and general mobility. Baseline: Goal status: INITIAL       LONG TERM GOALS: Target date: 06/27/2022     Patient to be free of vertigo symptoms to improve safety with bed mobility and sitting EOB to reduce risk for falls Baseline:  Goal status: INITIAL       ASSESSMENT:   CLINICAL IMPRESSION: Difficulty in achieving requisite cervical extension for optimal Epley maneuver/repositioning. Discussed performance of Brandt-Daroff and rolling for habituation if caregiver feels confident/comfortable in assisting.  Will try to place a plinth in Trendelenberg position to work around his lack of cervical extension   OBJECTIVE IMPAIRMENTS decreased balance and dizziness.    ACTIVITY LIMITATIONS sitting   PARTICIPATION LIMITATIONS:  safety with transfers   PERSONAL FACTORS 1 comorbidity: quadriplegia  are also affecting patient's functional outcome.    REHAB POTENTIAL: Good   CLINICAL DECISION MAKING: Evolving/moderate complexity   EVALUATION COMPLEXITY: Moderate     PLAN: PT FREQUENCY: 1-2x/week   PT DURATION: 6 weeks   PLANNED INTERVENTIONS: Therapeutic exercises, Therapeutic activity, Neuromuscular re-education, Balance training, Gait training, Patient/Family education, Joint mobilization, Vestibular training, Canalith repositioning, DME instructions, Spinal mobilization, and Manual therapy   PLAN FOR NEXT SESSION: Epley on plinth in Trendelenberg    1:01 PM, 06/06/22 M. Sherlyn Lees, PT, DPT Physical Therapist- [place] Office Number: [phone]

## 2022-06-11 ENCOUNTER — Ambulatory Visit: Payer: Medicare Other

## 2022-06-11 DIAGNOSIS — R42 Dizziness and giddiness: Secondary | ICD-10-CM

## 2022-06-11 NOTE — Therapy (Signed)
OUTPATIENT PHYSICAL THERAPY TREATMENT NOTE   Patient Name: Gary Burch MRN: [id number] DOB:July 30, 1961, 61 y.o., male Today's Date: 06/11/2022  PCP: Sueanne Margarita, DO  REFERRING PROVIDER: Sueanne Margarita, DO   END OF SESSION:   PT End of Session - 06/11/22 1304     Visit Number 5    Number of Visits 6    Date for PT Re-Evaluation 06/27/22    Authorization Type Medicare    Progress Note Due on Visit 10    PT Start Time 1315    PT Stop Time 1400    PT Time Calculation (min) 45 min    Equipment Utilized During Treatment Gait belt    Activity Tolerance Patient tolerated treatment well    Behavior During Therapy WFL for tasks assessed/performed             Past Medical History:  Diagnosis Date   Cataracts, bilateral    Cellulitis 01-2013   left leg   DVT (deep venous thrombosis) (HCC)    Elevated liver enzymes    " once, as a response to medication"   Heart murmur    Kidney stone    Pneumonia    Retinal tear    bilateral   Spine injury    UTI (lower urinary tract infection)    Past Surgical History:  Procedure Laterality Date   COLONOSCOPY WITH PROPOFOL N/A 03/23/2015   Procedure: COLONOSCOPY WITH PROPOFOL;  Surgeon: Milus Banister, MD;  Location: [facility];  Service: Endoscopy;  Laterality: N/A;   EYE SURGERY     bilateral cataract   GAS/FLUID EXCHANGE Right 11/10/2015   Procedure: GAS/FLUID EXCHANGE- C3F8 10% used;  Surgeon: Hurman Horn, MD;  Location: [place];  Service: Ophthalmology;  Laterality: Right;   PARS PLANA VITRECTOMY Right 11/10/2015   Procedure: PARS PLANA VITRECTOMY WITH 25 GAUGE with endolaser ;  Surgeon: Hurman Horn, MD;  Location: [place];  Service: Ophthalmology;  Laterality: Right;   RETINAL DETACHMENT SURGERY  09/20/2015   RETINAL TEAR REPAIR CRYOTHERAPY     SPINE SURGERY  11/20/1979   fusion   TONSILLECTOMY     Patient Active Problem List   Diagnosis Date Noted   Bruxism 02/07/2022   Partial recent retinal detachment with  single defect 02/07/2022   Seborrheic dermatitis of scalp 02/07/2022   Sinus congestion 02/07/2022   Constipation 05/03/2021   Edema 05/03/2021   Pain in left leg 05/03/2021   Melena 05/19/2020   Non-pressure chronic ulcer of other part of unspecified foot with unspecified severity ([place]) 05/19/2020   Other specified disorders of nose and nasal sinuses 05/19/2020   Personal history of diseases of the blood and blood-forming organs and certain disorders involving the immune mechanism 05/19/2020   Reduced mobility 05/19/2020   Thrombophilia ([place]) 05/19/2020   Encounter for general adult medical examination without abnormal findings 05/17/2020   Acquired deformity of pinna 01/20/2020   Abnormal levels of other serum enzymes 11/25/2019   Flatulence, eructation and gas pain 11/25/2019   History of malignant neoplasm of skin 11/25/2019   History of thromboembolism of vein 11/25/2019   History of urinary stone 11/25/2019   Acute swimmer's ear of right side 10/18/2017   Onychomycosis 10/19/2015   Quadriplegia ([place]) 03/22/2015   Neurogenic bladder 02/09/2013   Neurogenic bowel 02/09/2013   C6 spinal cord injury ([place]) 02/06/2012   Injury of ulnar nerve at right forearm level 02/06/2012   Rotator cuff syndrome of right shoulder 02/06/2012   Right lateral epicondylitis  02/06/2012    REFERRING DIAG: R42 (ICD-10-CM) - Dizziness and giddiness   THERAPY DIAG:  Dizziness and giddiness  Rationale for Evaluation and Treatment Rehabilitation  PERTINENT HISTORY: mentions work up in the past by Dr. Nyoka Cowden and "couldn't find anything". only happens when laying on his left side so encouraged him to change positions and I suspect it would help. no other red flag signs. urgent and emergent precautions were given. If persists said I would bring him in for an EKG. Look for triggers. He usually likes to sleep on his right side but has been giving him vertigo.   PRECAUTIONS: hx of C6  quadriplegia  SUBJECTIVE: "I've been sleeping on the right side for the past two nights to see if this would cause symptoms to appear.  Seems to be decreasing in intensity.  Attempted Brandt-Daroff with two family members assisting but limited trials due to difficulty"  PAIN:  Are you having pain? No   OBJECTIVE:   TODAY'S TREATMENT: 06/11/22 Activity Comments  Transfer to EOM mod-max A lateral scoot   Dix-Hallpike Right Up-beating nystagmus right  Right Epley maneuver x 3 Slight latency, fatigue after 18-30 sec                 DIAGNOSTIC FINDINGS: none at this time   COGNITION: Overall cognitive status: Within functional limits for tasks assessed             SENSATION: Not tested C6 quadriplegic       MUSCLE TONE: spasticity LLE > RLE         Cervical ROM:     Active A/PROM (deg) eval  Flexion WFL  Extension guarded  Right lateral flexion guarded  Left lateral flexion Guarded  Right rotation 30-40   Left rotation 30-40  (Blank rows = not tested) Guarded to movements of c-spine due to anxiety STRENGTH:      (Blank rows = not tested)   BED MOBILITY:  Rolling to Right Min A Rolling to Left Min A   TRANSFERS: Lateral scoot-pivot mod-max A       PATIENT SURVEYS:  FOTO to be completed     VESTIBULAR ASSESSMENT              GENERAL OBSERVATION: wears bifocals                        SYMPTOM BEHAVIOR:                       Subjective history: several months of dizziness with rolling in bed and supine to sit                       Non-Vestibular symptoms:  none associated, notes new tx for glaucoma using eye drops and this was also around the time symptoms began                       Type of dizziness: Spinning/Vertigo                       Frequency: when rolling/laying on right                       Duration: 15-30 sec                       Aggravating factors: Induced by position change:  rolling to the right and supine to sit                        Relieving factors:  laying on left                       Progression of symptoms: better, varies              OCULOMOTOR EXAM:                       Ocular Alignment: normal                       Ocular ROM: No Limitations                       Spontaneous Nystagmus: absent                       Gaze-Induced Nystagmus: absent                       Smooth Pursuits: intact                       Saccades: intact                       Convergence/Divergence: 3 cm                             VESTIBULAR - OCULAR REFLEX:                        Slow VOR: Normal                       VOR Cancellation: Normal                       Head-Impulse Test: HIT Right: positive HIT Left: negative                       Dynamic Visual Acuity: Not able to be assessed                        POSITIONAL TESTING: Right Roll Test: no nystagmus Left Roll Test: no nystagmus Right Sidelying: no nystagmus               MOTION SENSITIVITY:                         Motion Sensitivity Quotient   Intensity: 0 = none, 1 = Lightheaded, 2 = Mild, 3 = Moderate, 4 = Severe, 5 = Vomiting   Intensity  1. Sitting to supine    2. Supine to L side    3. Supine to R side    4. Supine to sitting 0  5. L Hallpike-Dix    6. Up from L     7. R Hallpike-Dix    8. Up from R     9. Sitting, head  tipped to L knee    10. Head up from L  knee    11. Sitting, head  tipped to R knee    12. Head up from R  knee  13. Sitting head turns x5    14.Sitting head nods x5    15. In stance, 180  turn to L     16. In stance, 180  turn to R                OTHOSTATICS: not done   FUNCTIONAL GAIT:  N/A, uses power chair exclusively     VESTIBULAR TREATMENT:   Canalith Repositioning:                       Comment: not indicated at this time, did not produce positional symptoms at this time . Pt educated on technique for self-Epley right based on report of symptoms Gaze Adaptation:                       To be  determined Habituation:                       To be determined Other:    PATIENT EDUCATION: Education details: self-Epley Person educated: Patient Education method: Explanation, Demonstration, and Handouts Education comprehension: verbalized understanding and needs further education     GOALS: Goals reviewed with patient? Yes   SHORT TERM GOALS: Target date: 06/06/2022    The patient will be independent with HEP for gaze adaptation, habituation, balance, and general mobility. Baseline: Goal status: INITIAL       LONG TERM GOALS: Target date: 06/27/2022     Patient to be free of vertigo symptoms to improve safety with bed mobility and sitting EOB to reduce risk for falls Baseline:  Goal status: INITIAL       ASSESSMENT:   CLINICAL IMPRESSION: Continues to exhibit positive right Dix-Hallpike with right upbeating w/ fatigue approx 15-30 sec.  Performed Epley x 3 trials but this was difficult as sole therapist present to perform mobility and positioning.  No change in symptoms between trials of Epley maneuver and right Dix-Hallpike.  Will attempt Semont at next session   OBJECTIVE IMPAIRMENTS decreased balance and dizziness.    ACTIVITY LIMITATIONS sitting   PARTICIPATION LIMITATIONS:  safety with transfers   PERSONAL FACTORS 1 comorbidity: quadriplegia  are also affecting patient's functional outcome.    REHAB POTENTIAL: Good   CLINICAL DECISION MAKING: Evolving/moderate complexity   EVALUATION COMPLEXITY: Moderate     PLAN: PT FREQUENCY: 1-2x/week   PT DURATION: 6 weeks   PLANNED INTERVENTIONS: Therapeutic exercises, Therapeutic activity, Neuromuscular re-education, Balance training, Gait training, Patient/Family education, Joint mobilization, Vestibular training, Canalith repositioning, DME instructions, Spinal mobilization, and Manual therapy   PLAN FOR NEXT SESSION: Semont? Recertification vs refer out to specialist?    1:05 PM, 06/11/22 M. Sherlyn Lees,  PT, DPT Physical Therapist- [place] Office Number: [phone]

## 2022-06-13 ENCOUNTER — Ambulatory Visit: Payer: Medicare Other

## 2022-06-19 ENCOUNTER — Ambulatory Visit: Payer: Medicare Other | Attending: Internal Medicine

## 2022-06-19 DIAGNOSIS — R42 Dizziness and giddiness: Secondary | ICD-10-CM | POA: Diagnosis present

## 2022-06-19 NOTE — Therapy (Signed)
OUTPATIENT PHYSICAL THERAPY TREATMENT NOTE   Patient Name: Gary Burch MRN: [id number] DOB:1961-03-19, 61 y.o., male Today's Date: 06/19/2022  PCP: Sueanne Margarita, DO  REFERRING PROVIDER: Sueanne Margarita, DO   END OF SESSION:   PT End of Session - 06/19/22 1306     Visit Number 6    Number of Visits 6    Date for PT Re-Evaluation 06/27/22    Authorization Type Medicare    Progress Note Due on Visit 10    PT Start Time 1315    PT Stop Time 1400    PT Time Calculation (min) 45 min    Equipment Utilized During Treatment Gait belt    Activity Tolerance Patient tolerated treatment well    Behavior During Therapy WFL for tasks assessed/performed             Past Medical History:  Diagnosis Date   Cataracts, bilateral    Cellulitis 01-2013   left leg   DVT (deep venous thrombosis) (HCC)    Elevated liver enzymes    " once, as a response to medication"   Heart murmur    Kidney stone    Pneumonia    Retinal tear    bilateral   Spine injury    UTI (lower urinary tract infection)    Past Surgical History:  Procedure Laterality Date   COLONOSCOPY WITH PROPOFOL N/A 03/23/2015   Procedure: COLONOSCOPY WITH PROPOFOL;  Surgeon: Milus Banister, MD;  [facility];  Service: Endoscopy;  Laterality: N/A;   EYE SURGERY     bilateral cataract   GAS/FLUID EXCHANGE Right 11/10/2015   Procedure: GAS/FLUID EXCHANGE- C3F8 10% used;  Surgeon: Hurman Horn, MD;  Location: [place];  Service: Ophthalmology;  Laterality: Right;   PARS PLANA VITRECTOMY Right 11/10/2015   Procedure: PARS PLANA VITRECTOMY WITH 25 GAUGE with endolaser ;  Surgeon: Hurman Horn, MD;  Location: [place];  Service: Ophthalmology;  Laterality: Right;   RETINAL DETACHMENT SURGERY  09/20/2015   RETINAL TEAR REPAIR CRYOTHERAPY     SPINE SURGERY  11/20/1979   fusion   TONSILLECTOMY     Patient Active Problem List   Diagnosis Date Noted   Bruxism 02/07/2022   Partial recent retinal detachment with single  defect 02/07/2022   Seborrheic dermatitis of scalp 02/07/2022   Sinus congestion 02/07/2022   Constipation 05/03/2021   Edema 05/03/2021   Pain in left leg 05/03/2021   Melena 05/19/2020   Non-pressure chronic ulcer of other part of unspecified foot with unspecified severity ([place]) 05/19/2020   Other specified disorders of nose and nasal sinuses 05/19/2020   Personal history of diseases of the blood and blood-forming organs and certain disorders involving the immune mechanism 05/19/2020   Reduced mobility 05/19/2020   Thrombophilia ([place]) 05/19/2020   Encounter for general adult medical examination without abnormal findings 05/17/2020   Acquired deformity of pinna 01/20/2020   Abnormal levels of other serum enzymes 11/25/2019   Flatulence, eructation and gas pain 11/25/2019   History of malignant neoplasm of skin 11/25/2019   History of thromboembolism of vein 11/25/2019   History of urinary stone 11/25/2019   Acute swimmer's ear of right side 10/18/2017   Onychomycosis 10/19/2015   Quadriplegia ([place]) 03/22/2015   Neurogenic bladder 02/09/2013   Neurogenic bowel 02/09/2013   C6 spinal cord injury ([place]) 02/06/2012   Injury of ulnar nerve at right forearm level 02/06/2012   Rotator cuff syndrome of right shoulder 02/06/2012   Right lateral epicondylitis  02/06/2012    REFERRING DIAG: R42 (ICD-10-CM) - Dizziness and giddiness   THERAPY DIAG:  Dizziness and giddiness  Rationale for Evaluation and Treatment Rehabilitation  PERTINENT HISTORY: mentions work up in the past by Dr. Nyoka Cowden and "couldn't find anything". only happens when laying on his left side so encouraged him to change positions and I suspect it would help. no other red flag signs. urgent and emergent precautions were given. If persists said I would bring him in for an EKG. Look for triggers. He usually likes to sleep on his right side but has been giving him vertigo.   PRECAUTIONS: hx of C6 quadriplegia  SUBJECTIVE:  Intensity of symptoms has decreased, trying to perform Brandt-Daroff with assistance, performed in a slightly modified way due to caregiver limitations  PAIN:  Are you having pain? No   OBJECTIVE:    TODAY'S TREATMENT: 06/19/22 Activity Comments  Semont maneuver x 2 trials Right upbeating nystagmus  Brandt-Daroff position right ear Some latency, 15-30 sec nystagmus  Pt education on progression and symptom monitoring                    DIAGNOSTIC FINDINGS: none at this time   COGNITION: Overall cognitive status: Within functional limits for tasks assessed             SENSATION: Not tested C6 quadriplegic       MUSCLE TONE: spasticity LLE > RLE         Cervical ROM:     Active A/PROM (deg) eval  Flexion WFL  Extension guarded  Right lateral flexion guarded  Left lateral flexion Guarded  Right rotation 30-40   Left rotation 30-40  (Blank rows = not tested) Guarded to movements of c-spine due to anxiety STRENGTH:      (Blank rows = not tested)   BED MOBILITY:  Rolling to Right Min A Rolling to Left Min A   TRANSFERS: Lateral scoot-pivot mod-max A       PATIENT SURVEYS:  FOTO to be completed     VESTIBULAR ASSESSMENT              GENERAL OBSERVATION: wears bifocals                        SYMPTOM BEHAVIOR:                       Subjective history: several months of dizziness with rolling in bed and supine to sit                       Non-Vestibular symptoms:  none associated, notes new tx for glaucoma using eye drops and this was also around the time symptoms began                       Type of dizziness: Spinning/Vertigo                       Frequency: when rolling/laying on right                       Duration: 15-30 sec                       Aggravating factors: Induced by position change: rolling to the right and supine to sit  Relieving factors:  laying on left                       Progression of symptoms: better,  varies              OCULOMOTOR EXAM:                       Ocular Alignment: normal                       Ocular ROM: No Limitations                       Spontaneous Nystagmus: absent                       Gaze-Induced Nystagmus: absent                       Smooth Pursuits: intact                       Saccades: intact                       Convergence/Divergence: 3 cm                             VESTIBULAR - OCULAR REFLEX:                        Slow VOR: Normal                       VOR Cancellation: Normal                       Head-Impulse Test: HIT Right: positive HIT Left: negative                       Dynamic Visual Acuity: Not able to be assessed                        POSITIONAL TESTING: Right Roll Test: no nystagmus Left Roll Test: no nystagmus Right Sidelying: no nystagmus               MOTION SENSITIVITY:                         Motion Sensitivity Quotient   Intensity: 0 = none, 1 = Lightheaded, 2 = Mild, 3 = Moderate, 4 = Severe, 5 = Vomiting   Intensity  1. Sitting to supine    2. Supine to L side    3. Supine to R side    4. Supine to sitting 0  5. L Hallpike-Dix    6. Up from L     7. R Hallpike-Dix    8. Up from R     9. Sitting, head  tipped to L knee    10. Head up from L  knee    11. Sitting, head  tipped to R knee    12. Head up from R  knee    13. Sitting head turns x5    14.Sitting head nods x5    15. In stance, 180  turn to L  16. In stance, 180  turn to R                OTHOSTATICS: not done   FUNCTIONAL GAIT:  N/A, uses power chair exclusively     VESTIBULAR TREATMENT:   Canalith Repositioning:                       Comment: not indicated at this time, did not produce positional symptoms at this time . Pt educated on technique for self-Epley right based on report of symptoms Gaze Adaptation:                       To be determined Habituation:                       To be determined Other:    PATIENT  EDUCATION: Education details: self-Epley Person educated: Patient Education method: Explanation, Demonstration, and Handouts Education comprehension: verbalized understanding and needs further education     GOALS: Goals reviewed with patient? Yes   SHORT TERM GOALS: Target date: 06/06/2022    The patient will be independent with HEP for gaze adaptation, habituation, balance, and general mobility. Baseline: requires caregiver assist due to mobility limitations for habituation, able to perform rolling for habituation using hospital bed features Goal status: Partially met       LONG TERM GOALS: Target date: 06/27/2022     Patient to be free of vertigo symptoms to improve safety with bed mobility and sitting EOB to reduce risk for falls Baseline: continued right upbeating nystagmus Goal status: NOT MET       ASSESSMENT:   CLINICAL IMPRESSION: Continues to exhibit positive right Dix-Hallpike with right upbeating w/ fatigue approx 15-30 sec.  Performed two rounds of Semont maneuver for right ear with nystagmus remaining present after each treatment.  Pt reports symptoms have overall decreased and have not been limiting his ability to perform sidelying in bed at this time and episodes have been transient without lasting symptoms.  Advised to continue Brandt-Daroff positioning and monitor symptoms.  Will hold PT sessions at this time due to lack of progress and advised to continue to monitor for change in position sensitivity/intensity.    OBJECTIVE IMPAIRMENTS decreased balance and dizziness.    ACTIVITY LIMITATIONS sitting   PARTICIPATION LIMITATIONS:  safety with transfers   PERSONAL FACTORS 1 comorbidity: quadriplegia  are also affecting patient's functional outcome.    REHAB POTENTIAL: Good   CLINICAL DECISION MAKING: Evolving/moderate complexity   EVALUATION COMPLEXITY: Moderate     PLAN: PT FREQUENCY: 1-2x/week   PT DURATION: 6 weeks   PLANNED INTERVENTIONS: Therapeutic  exercises, Therapeutic activity, Neuromuscular re-education, Balance training, Gait training, Patient/Family education, Joint mobilization, Vestibular training, Canalith repositioning, DME instructions, Spinal mobilization, and Manual therapy   PLAN FOR NEXT SESSION: Hold sessions with pt instructed in self-monitoring    1:07 PM, 06/19/22 M. Sherlyn Lees, PT, DPT Physical Therapist- [place] Office Number: [phone]

## 2022-06-26 ENCOUNTER — Encounter: Payer: Self-pay | Admitting: Podiatry

## 2022-06-26 ENCOUNTER — Ambulatory Visit (INDEPENDENT_AMBULATORY_CARE_PROVIDER_SITE_OTHER): Payer: Medicare Other | Admitting: Podiatry

## 2022-06-26 DIAGNOSIS — B351 Tinea unguium: Secondary | ICD-10-CM | POA: Diagnosis not present

## 2022-06-26 DIAGNOSIS — G6289 Other specified polyneuropathies: Secondary | ICD-10-CM

## 2022-06-26 NOTE — Progress Notes (Signed)
This patient returns to my office for at risk foot care.  This patient requires this care by a professional since this patient will be at risk due to having due to coagulation defect due to xarelto.  Patient is quadraplegia  and in a wheelchair.This patient is unable to cut nails himself since the patient cannot reach his nails.These nails are painful walking and wearing shoes. This patient presents for at risk foot care today.  General Appearance  Alert, conversant and in no acute stress.  Vascular  Dorsalis pedis and posterior tibial  pulses are palpable  bilaterally.  Capillary return is within normal limits  bilaterally. Temperature is within normal limits  bilaterally.  Neurologic  Senn-Weinstein monofilament wire test within normal limits  bilaterally. Muscle power within normal limits bilaterally.  Nails Thick disfigured discolored nails with subungual debris  from hallux to fifth toes bilaterally. No evidence of bacterial infection or drainage bilaterally.   Orthopedic  No limitations of motion  feet .  No crepitus or effusions noted.  No bony pathology or digital deformities noted.  Skin  normotropic skin with no porokeratosis noted bilaterally.  No signs of infections or ulcers noted.      Onychomycosis  Pain in right toes  Pain in left toes  Consent was obtained for treatment procedures.   Mechanical debridement of nails 1-5  bilaterally performed with a nail nipper.  Filed with dremel without incident.    Return office visit   9 weeks                   Told patient to return for periodic foot care and evaluation due to potential at risk complications.   Freddi Forster DPM  

## 2022-06-27 ENCOUNTER — Ambulatory Visit: Payer: Medicare Other

## 2022-07-04 ENCOUNTER — Encounter: Payer: Medicare Other | Attending: Physical Medicine & Rehabilitation | Admitting: Physical Medicine & Rehabilitation

## 2022-07-04 ENCOUNTER — Encounter: Payer: Self-pay | Admitting: Physical Medicine & Rehabilitation

## 2022-07-04 VITALS — BP 95/56 | HR 60 | Ht 72.0 in

## 2022-07-04 DIAGNOSIS — N319 Neuromuscular dysfunction of bladder, unspecified: Secondary | ICD-10-CM | POA: Diagnosis present

## 2022-07-04 DIAGNOSIS — S14106S Unspecified injury at C6 level of cervical spinal cord, sequela: Secondary | ICD-10-CM | POA: Diagnosis present

## 2022-07-04 NOTE — Patient Instructions (Addendum)
YOU NEED TO DO YOUR EYE/VESTIBULAR EXERCISES EACH DAY AT LEAST UNTIL VERTIGO SUBSIDES.   EVEN AFTER SYMPTOMS SUBSIDES I WOULD RECOMMEND CONTINUING YOUR EXERCISES EVERY OTHER DAY OR SO.   IF SYMPTOMS DON'T IMPROVE WITH EXERCISES, YOU SHOULD SEE AN ENT FOR FURTHER ASSESSMENT OF YOUR EAR, ETC

## 2022-07-04 NOTE — Progress Notes (Signed)
Subjective:    Patient ID: Gary Burch, male    DOB: [birth year], 61 y.o.   MRN: [id number]  HPI Mr. Maker is here in follow up of his C6 SCI. He began experiencing symptoms of vertigo about 6 months ago. He has gone through a course of vestibular rehab a few weeks ago which helped the symptoms but didn't eradicate them. He notices the symptoms only for seconds when he looks up. He is not doing his exercises regularly however.   He received his new powered w/c a couple months ago. His back pain has increased quite a bit to the point where he's put a foam pad in front of the seat back last week. The pain has improved with the pad in place.   Bowel and bladder programs stable. Skin intact  Pain Inventory Average Pain 2 Pain Right Now 2 My pain is intermittent, constant, and sharp  LOCATION OF PAIN  shoulder, elbow, wrist, back, buttocks  BOWEL Number of stools per week: 3-4 Oral laxative use Yes  Type of laxative Citracal Enema or suppository use Yes    BLADDER  In and out cath, frequency in/out 2x;s daily Able to self cath  with help Bladder incontinence Yes   Incomplete bladder emptying Yes    Mobility use a wheelchair needs help with transfers Do you have any goals in this area?  yes  Function disabled: date disabled 77 I need assistance with the following:  dressing, bathing, toileting, meal prep, household duties, and shopping Do you have any goals in this area?  yes  Neuro/Psych bladder control problems bowel control problems tingling trouble walking spasms  Prior Studies Any changes since last visit?  no  Physicians involved in your care Any changes since last visit?  yes [facility]  Family History  Problem Relation Age of Onset   Colon polyps Father    Parkinson's disease Father    Alzheimer's disease Father    Hypertension Mother    Heart disease Mother    Social History   Socioeconomic History   Marital status: Single    Spouse  name: Not on file   Number of children: Not on file   Years of education: Not on file   Highest education level: Not on file  Occupational History   Not on file  Tobacco Use   Smoking status: Never   Smokeless tobacco: Never  Vaping Use   Vaping Use: Never used  Substance and Sexual Activity   Alcohol use: Yes    Alcohol/week: 0.0 standard drinks of alcohol    Comment: occasional   Drug use: No   Sexual activity: Not Currently  Other Topics Concern   Not on file  Social History Narrative   Not on file   Social Determinants of Health   Financial Resource Strain: Not on file  Food Insecurity: Not on file  Transportation Needs: Not on file  Physical Activity: Not on file  Stress: Not on file  Social Connections: Not on file   Past Surgical History:  Procedure Laterality Date   COLONOSCOPY WITH PROPOFOL N/A 03/23/2015   Procedure: COLONOSCOPY WITH PROPOFOL;  Surgeon: Milus Banister, MD;  Location: [facility];  Service: Endoscopy;  Laterality: N/A;   EYE SURGERY     bilateral cataract   GAS/FLUID EXCHANGE Right 11/10/2015   Procedure: GAS/FLUID EXCHANGE- C3F8 10% used;  Surgeon: Hurman Horn, MD;  Location: [facility];  Service: Ophthalmology;  Laterality: Right;   PARS  PLANA VITRECTOMY Right 11/10/2015   Procedure: PARS PLANA VITRECTOMY WITH 25 GAUGE with endolaser ;  Surgeon: Hurman Horn, MD;  Location: [facility];  Service: Ophthalmology;  Laterality: Right;   RETINAL DETACHMENT SURGERY  09/20/2015   RETINAL TEAR REPAIR CRYOTHERAPY     RETINAL TEAR REPAIR CRYOTHERAPY  09/2021   left repair   SPINE SURGERY  11/20/1979   fusion   TONSILLECTOMY     Past Medical History:  Diagnosis Date   Cataracts, bilateral    Cellulitis 01-2013   left leg   DVT (deep venous thrombosis) (HCC)    Elevated liver enzymes    " once, as a response to medication"   Heart murmur    Kidney stone    Pneumonia    Retinal tear    bilateral   Spine injury    UTI (lower urinary tract  infection)    Ht 6' (1.829 m)   BMI 22.38 kg/m   Opioid Risk Score:   Fall Risk Score:  `1  Depression screen [facility] 2/9     07/04/2022   12:50 PM 07/05/2021    1:11 PM 07/06/2020    1:52 PM 07/08/2019    1:54 PM  Depression screen PHQ 2/9  Decreased Interest 0 0 0 0  Down, Depressed, Hopeless 0 0 0 0  PHQ - 2 Score 0 0 0 0    Review of Systems  Cardiovascular:  Positive for leg swelling.  Gastrointestinal:  Positive for abdominal pain and constipation.  Genitourinary:        Incontinence  Musculoskeletal:  Positive for gait problem.       SPASMS  Neurological:        TINGLING  Hematological:  Bruises/bleeds easily.  All other systems reviewed and are negative.      Objective:   Physical Exam General: No acute distress HEENT: NCAT, EOMI, oral membranes moist Cards: reg rate  Chest: normal effort Abdomen: Soft, NT, ND Skin: dry, intact Extremities: no edema Psych: pleasant and appropriate  Skin: dry, intact. keratoses on back  Musculoskeletal: elbow ROM normal Neurological: He is alert and oriented to person, place, and time. nystagmus with gaze to left only. Consistent weakness below the C6 level. DTR's 3+  continued clonus in LLE > RLE,         Assessment & Plan:   1. C6 spinal cord injury, complete.  2. Right lateral epicondylitis with ulnar nerve irritation, involving left too 3. Right rotator cuff syndrome  4. Neurogenic bowel and bladder  5. Thrombocytopenia: likely due to lyrica (delayed response).  6. Bilateral LE DVT's     Plan:  1. Continue xarelto for  life long anticoagulation.  2. needs to continue vestibular exercises at home. Might be helpful to see ENT for evaluation of his ear/further w/u    4. Continue home exercise program to a reasonable extent.  5. Bladder per urology. continue I/O caths BID 6. New chair generally fitting appropriately. Addnl seat padding per vendor    15 minutes of face to face patient care time were spent during  this visit. All questions were encouraged and answered.  Follow up with me in 1 year .

## 2022-07-06 ENCOUNTER — Telehealth: Payer: Self-pay | Admitting: *Deleted

## 2022-07-06 NOTE — Therapy (Signed)
[facility] [address], [place], [place], [address] Phone: [phone]   Fax:  [phone]  Patient Details  Name: Gary Burch MRN: [id number] Date of Birth: 1961/04/12 Referring Provider:  No ref. provider found  Encounter Date: 07/06/2022  PHYSICAL THERAPY DISCHARGE SUMMARY  Visits from Start of Care: 8  Current functional level related to goals / functional outcomes: Unable to resolve positional vertigo.    Remaining deficits: Positional vertigo remains. +Right Dix-Hallpike with right upbeating nystagmus   Education / Equipment: HEP and positional habituation maneuvers to perform with caregivers   Patient agrees to discharge. Patient goals were not met. Patient is being discharged due to did not respond to therapy.   Toniann Fail, PT 07/06/2022, 10:07 AM  [facility] [address], [place], [place], [address] Phone: [phone]   Fax:  [phone]

## 2022-07-06 NOTE — Telephone Encounter (Signed)
Patient is calling because he is concerned about a brownish black tender place on right 2nd toe,has applied neosporin on it but wanted the physician's opinion. Please advise,schedule for a sooner appointment

## 2022-07-10 ENCOUNTER — Telehealth: Payer: Self-pay | Admitting: Podiatry

## 2022-07-10 NOTE — Telephone Encounter (Addendum)
Called patient at home.  He was concerned about a black disfiguration on the tip of second toe right foot.  He texted me a picture and said he had developed this black lesion but there was no pain associated with lesion.  Patient is quadraplegic so his sensation is diminished.  I looked at the image and there was an area on the second toe which resembled cauterization of the toe or skin necrosis.  I checked my notes from 8/26 visit and no mention of cauterization was in my notes.  We talked some more and he believes it could be due to the compression hose which he has stopped wearing. Since there was no pain I told him to watch the toe for redness and/or swelling and contact the office.  Gardiner Barefoot DPM

## 2022-07-14 ENCOUNTER — Ambulatory Visit: Payer: Medicare Other | Admitting: Podiatry

## 2022-09-03 ENCOUNTER — Ambulatory Visit (INDEPENDENT_AMBULATORY_CARE_PROVIDER_SITE_OTHER): Payer: Medicare Other | Admitting: Podiatry

## 2022-09-03 ENCOUNTER — Encounter: Payer: Self-pay | Admitting: Podiatry

## 2022-09-03 DIAGNOSIS — B351 Tinea unguium: Secondary | ICD-10-CM | POA: Diagnosis not present

## 2022-09-03 DIAGNOSIS — G6289 Other specified polyneuropathies: Secondary | ICD-10-CM | POA: Diagnosis not present

## 2022-09-03 NOTE — Progress Notes (Signed)
This patient returns to my office for at risk foot care.  This patient requires this care by a professional since this patient will be at risk due to having due to coagulation defect due to xarelto.  Patient is quadraplegia  and in a wheelchair.This patient is unable to cut nails himself since the patient cannot reach his nails.These nails are painful walking and wearing shoes. This patient presents for at risk foot care today.  General Appearance  Alert, conversant and in no acute stress.  Vascular  Dorsalis pedis and posterior tibial  pulses are palpable  bilaterally.  Capillary return is within normal limits  bilaterally. Temperature is within normal limits  bilaterally.  Neurologic  Senn-Weinstein monofilament wire test within normal limits  bilaterally. Muscle power within normal limits bilaterally.  Nails Thick disfigured discolored nails with subungual debris  from hallux to fifth toes bilaterally. No evidence of bacterial infection or drainage bilaterally.   Orthopedic  No limitations of motion  feet .  No crepitus or effusions noted.  No bony pathology or digital deformities noted.  Skin  normotropic skin with no porokeratosis noted bilaterally.  No signs of infections or ulcers noted.      Onychomycosis  Pain in right toes  Pain in left toes  Consent was obtained for treatment procedures.   Mechanical debridement of nails 1-5  bilaterally performed with a nail nipper.  Filed with dremel without incident.    Return office visit   9 weeks                   Told patient to return for periodic foot care and evaluation due to potential at risk complications.   Janyce Ellinger DPM  

## 2022-11-07 ENCOUNTER — Encounter: Payer: Self-pay | Admitting: Podiatry

## 2022-11-07 ENCOUNTER — Ambulatory Visit (INDEPENDENT_AMBULATORY_CARE_PROVIDER_SITE_OTHER): Payer: Medicare Other | Admitting: Podiatry

## 2022-11-07 DIAGNOSIS — G6289 Other specified polyneuropathies: Secondary | ICD-10-CM

## 2022-11-07 DIAGNOSIS — B351 Tinea unguium: Secondary | ICD-10-CM

## 2022-11-07 DIAGNOSIS — S14106S Unspecified injury at C6 level of cervical spinal cord, sequela: Secondary | ICD-10-CM

## 2022-11-07 NOTE — Progress Notes (Signed)
This patient returns to my office for at risk foot care.  This patient requires this care by a professional since this patient will be at risk due to having due to coagulation defect due to xarelto.  Patient is quadraplegia  and in a wheelchair.This patient is unable to cut nails himself since the patient cannot reach his nails.These nails are painful walking and wearing shoes. This patient presents for at risk foot care today.  General Appearance  Alert, conversant and in no acute stress.  Vascular  Dorsalis pedis and posterior tibial  pulses are palpable  bilaterally.  Capillary return is within normal limits  bilaterally. Temperature is within normal limits  bilaterally.  Neurologic  Senn-Weinstein monofilament wire test within normal limits  bilaterally. Muscle power within normal limits bilaterally.  Nails Thick disfigured discolored nails with subungual debris  from hallux to fifth toes bilaterally. No evidence of bacterial infection or drainage bilaterally.   Orthopedic  No limitations of motion  feet .  No crepitus or effusions noted.  No bony pathology or digital deformities noted.  Skin  normotropic skin with no porokeratosis noted bilaterally.  No signs of infections or ulcers noted.      Onychomycosis  Pain in right toes  Pain in left toes  Consent was obtained for treatment procedures.   Mechanical debridement of nails 1-5  bilaterally performed with a nail nipper.  Filed with dremel without incident.    Return office visit   9 weeks                   Told patient to return for periodic foot care and evaluation due to potential at risk complications.   Nolan Tuazon DPM  

## 2023-01-16 ENCOUNTER — Ambulatory Visit (INDEPENDENT_AMBULATORY_CARE_PROVIDER_SITE_OTHER): Payer: Medicare Other | Admitting: Podiatry

## 2023-01-16 ENCOUNTER — Encounter: Payer: Self-pay | Admitting: Podiatry

## 2023-01-16 DIAGNOSIS — B351 Tinea unguium: Secondary | ICD-10-CM | POA: Diagnosis not present

## 2023-01-16 DIAGNOSIS — G6289 Other specified polyneuropathies: Secondary | ICD-10-CM

## 2023-01-16 NOTE — Progress Notes (Signed)
This patient returns to my office for at risk foot care.  This patient requires this care by a professional since this patient will be at risk due to having due to coagulation defect due to xarelto.  Patient is quadraplegia  and in a wheelchair.This patient is unable to cut nails himself since the patient cannot reach his nails.These nails are painful walking and wearing shoes. This patient presents for at risk foot care today.  General Appearance  Alert, conversant and in no acute stress.  Vascular  Dorsalis pedis and posterior tibial  pulses are palpable  bilaterally.  Capillary return is within normal limits  bilaterally. Temperature is within normal limits  bilaterally.  Neurologic  Senn-Weinstein monofilament wire test within normal limits  bilaterally. Muscle power within normal limits bilaterally.  Nails Thick disfigured discolored nails with subungual debris  from hallux to fifth toes bilaterally. No evidence of bacterial infection or drainage bilaterally.   Orthopedic  No limitations of motion  feet .  No crepitus or effusions noted.  No bony pathology or digital deformities noted.  Skin  normotropic skin with no porokeratosis noted bilaterally.  No signs of infections or ulcers noted.      Onychomycosis  Pain in right toes  Pain in left toes  Consent was obtained for treatment procedures.   Mechanical debridement of nails 1-5  bilaterally performed with a nail nipper.  Filed with dremel without incident.    Return office visit   9 weeks                   Told patient to return for periodic foot care and evaluation due to potential at risk complications.   Gardiner Barefoot DPM

## 2023-02-14 ENCOUNTER — Ambulatory Visit: Payer: Medicare Other | Admitting: Podiatry

## 2023-03-27 ENCOUNTER — Ambulatory Visit (INDEPENDENT_AMBULATORY_CARE_PROVIDER_SITE_OTHER): Payer: Medicare Other | Admitting: Podiatry

## 2023-03-27 ENCOUNTER — Encounter: Payer: Self-pay | Admitting: Podiatry

## 2023-03-27 DIAGNOSIS — B351 Tinea unguium: Secondary | ICD-10-CM

## 2023-03-27 DIAGNOSIS — G6289 Other specified polyneuropathies: Secondary | ICD-10-CM

## 2023-03-27 NOTE — Progress Notes (Signed)
This patient returns to my office for at risk foot care.  This patient requires this care by a professional since this patient will be at risk due to having due to coagulation defect due to xarelto.  Patient is quadraplegia  and in a wheelchair. °This patient is unable to cut nails himself since the patient cannot reach his nails.These nails are painful walking and wearing shoes.  This patient presents for at risk foot care today. ° °General Appearance  Alert, conversant and in no acute stress. ° °Vascular  Dorsalis pedis and posterior tibial  pulses are palpable  bilaterally.  Capillary return is within normal limits  bilaterally. Temperature is within normal limits  bilaterally. ° °Neurologic  Senn-Weinstein monofilament wire test within normal limits  bilaterally. Muscle power within normal limits bilaterally. ° °Nails Thick disfigured discolored nails with subungual debris  from hallux to fifth toes bilaterally. No evidence of bacterial infection or drainage bilaterally. ° °Orthopedic  No limitations of motion  feet .  No crepitus or effusions noted.  No bony pathology or digital deformities noted. ° °Skin  normotropic skin with no porokeratosis noted bilaterally.  No signs of infections or ulcers noted.    ° °Onychomycosis  Pain in right toes  Pain in left toes ° °Consent was obtained for treatment procedures.   Mechanical debridement of nails 1-5  bilaterally performed with a nail nipper.  Filed with dremel without incident.  ° ° °Return office visit   9 weeks                   Told patient to return for periodic foot care and evaluation due to potential at risk complications. ° ° °Julianny Milstein DPM  °

## 2023-05-22 ENCOUNTER — Ambulatory Visit: Payer: Medicare Other | Admitting: Gastroenterology

## 2023-05-22 ENCOUNTER — Encounter: Payer: Self-pay | Admitting: Physician Assistant

## 2023-05-22 ENCOUNTER — Ambulatory Visit (INDEPENDENT_AMBULATORY_CARE_PROVIDER_SITE_OTHER): Payer: Medicare Other | Admitting: Physician Assistant

## 2023-05-22 VITALS — BP 80/50 | HR 59

## 2023-05-22 DIAGNOSIS — K649 Unspecified hemorrhoids: Secondary | ICD-10-CM | POA: Diagnosis not present

## 2023-05-22 DIAGNOSIS — Z1211 Encounter for screening for malignant neoplasm of colon: Secondary | ICD-10-CM | POA: Diagnosis not present

## 2023-05-22 NOTE — Progress Notes (Signed)
Subjective:    Patient ID: Gary Burch, male    DOB: 18-Dec-1960, 62 y.o.   MRN: [id number]  HPI  Gary Burch is a 62 year old white male, previously established with Dr. Christella Hartigan, who comes in today to discuss whether he needs follow-up colonoscopy. He was last seen by Dr. Christella Hartigan in November 2022.  At that time he had been having some looser stools and had noted some intermittent minor rectal bleeding.  He had had a anoscopy in 2021 with finding of a small internal hemorrhoid, and it was felt that his low-grade bleeding was likely hemorrhoidal.  Patient is a paraplegic and has to have digital stimulation every other day in order to produce a bowel movement.  He says that he thinks that bleeding is precipitated by the digital stimulation and irritation from that at times. Over the past year and a half he has continued to just occasionally be told that there is a small amount of bright red blood with wiping or when digital stimulation is done per the home health aide a couple of times per week.  He has been using a new device this year for the anal rectal stimulation and says that seems to be causing less issues and he is not been told that there is been any blood over the past couple of months. No complaints of abdominal pain, bowel habits are normal for him usually 2-3 bowel movements per week, he has been taking Metamucil crackers couple of times per week. He did have colonoscopy in May 2016, had to be admitted for bowel prep etc., that exam was normal, and at that time had been indicated for 10-year interval follow-up.  He has no family history of colon cancer or advanced polyps that he knows of. Patient has had a C6 spinal cord injury remotely, has neurogenic bowel and bladder, quadriplegia associated with dysautonomia, and also with history of prior bilateral DVTs on chronic Xarelto.  Review of Systems Pertinent positive and negative review of systems were noted in the above HPI section.  All other  review of systems was otherwise negative.   Outpatient Encounter Medications as of 05/22/2023  Medication Sig   baclofen (LIORESAL) 10 MG tablet Take 1 tablet by mouth 4 (four) times daily.   bethanechol (URECHOLINE) 25 MG tablet Take 50 mg by mouth 3 (three) times daily.   clindamycin (CLEOCIN T) 1 % lotion APPLY 1 APPLICATION ON THE SKIN IN THE MORNING   clindamycin-benzoyl peroxide (BENZACLIN) gel Apply 1 application topically daily as needed (for acne).    clindamycin-tretinoin (ZIANA) gel See admin instructions.   Cranberry 425 MG CAPS take once a day   diazepam (VALIUM) 2 MG tablet Take 2 mg by mouth 2 (two) times daily.   doxazosin (CARDURA) 4 MG tablet Take 4 mg by mouth daily.   latanoprost (XALATAN) 0.005 % ophthalmic solution SMARTSIG:1 Drop(s) In Eye(s) Every Evening   levocetirizine (XYZAL) 5 MG tablet 1 tablet in the evening Orally Once a day   Loperamide HCl (IMODIUM PO) Take by mouth daily.   Multiple Vitamin (MULTIVITAMIN WITH MINERALS) TABS tablet Take 1 tablet by mouth daily.   mupirocin ointment (BACTROBAN) 2 % Apply 1 application topically daily as needed (for skin).    pregabalin (LYRICA) 75 MG capsule take 2 capsules Oral Twice a day for 90 days   Probiotic Product (PROBIOTIC DAILY) CAPS Take 1 capsule by mouth daily.   SF 5000 PLUS 1.1 % CREA dental cream Place 1 application onto teeth  at bedtime.    tretinoin (RETIN-A) 0.1 % cream Apply 1 application topically at bedtime.   triamcinolone ointment (KENALOG) 0.1 % APPLY A SMALL DAB TO FINGER AND PLACE IN EXTERNAL EAR CANALS FOR ITCHING, TWICE DAILY PRN.   UNABLE TO FIND Take by mouth.   Vitamins/Minerals TABS Take by mouth.   XARELTO 15 MG TABS tablet    [DISCONTINUED] doxazosin (CARDURA) 2 MG tablet Take 2 mg by mouth as directed.   No facility-administered encounter medications on file as of 05/22/2023.   Allergies  Allergen Reactions   Macrodantin Rash   Sulfa Antibiotics Rash    Other reaction(s): hives, rash    Patient Active Problem List   Diagnosis Date Noted   Bruxism 02/07/2022   Partial recent retinal detachment with single defect 02/07/2022   Seborrheic dermatitis of scalp 02/07/2022   Sinus congestion 02/07/2022   Constipation 05/03/2021   Edema 05/03/2021   Pain in left leg 05/03/2021   Melena 05/19/2020   Non-pressure chronic ulcer of other part of unspecified foot with unspecified severity (HCC) 05/19/2020   Other specified disorders of nose and nasal sinuses 05/19/2020   Personal history of diseases of the blood and blood-forming organs and certain disorders involving the immune mechanism 05/19/2020   Reduced mobility 05/19/2020   Thrombophilia (HCC) 05/19/2020   Encounter for general adult medical examination without abnormal findings 05/17/2020   Acquired deformity of pinna 01/20/2020   Abnormal levels of other serum enzymes 11/25/2019   Flatulence, eructation and gas pain 11/25/2019   History of malignant neoplasm of skin 11/25/2019   History of thromboembolism of vein 11/25/2019   History of urinary stone 11/25/2019   Acute swimmer's ear of right side 10/18/2017   Onychomycosis 10/19/2015   Quadriplegia (HCC) 03/22/2015   Neurogenic bladder 02/09/2013   Neurogenic bowel 02/09/2013   C6 spinal cord injury (HCC) 02/06/2012   Injury of ulnar nerve at right forearm level 02/06/2012   Rotator cuff syndrome of right shoulder 02/06/2012   Right lateral epicondylitis 02/06/2012   Social History   Socioeconomic History   Marital status: Single    Spouse name: Not on file   Number of children: Not on file   Years of education: Not on file   Highest education level: Not on file  Occupational History   Not on file  Tobacco Use   Smoking status: Never   Smokeless tobacco: Never  Vaping Use   Vaping Use: Never used  Substance and Sexual Activity   Alcohol use: Yes    Alcohol/week: 0.0 standard drinks of alcohol    Comment: occasional   Drug use: No   Sexual activity:  Not Currently  Other Topics Concern   Not on file  Social History Narrative   Not on file   Social Determinants of Health   Financial Resource Strain: Not on file  Food Insecurity: Not on file  Transportation Needs: Not on file  Physical Activity: Not on file  Stress: Not on file  Social Connections: Not on file  Intimate Partner Violence: Not on file    Gary Burch's family history includes Alzheimer's disease in his father; Colon polyps in his father; Heart disease in his mother; Hypertension in his mother; Parkinson's disease in his father.      Objective:    Vitals:   05/22/23 1136  BP: (!) 80/50  Pulse: (!) 59    Physical Exam Well-developed older WM in wheelchair in no acute distress. Paraplegia ,Here alone .  Height,  [id number]  BMI  HEENT; nontraumatic normocephalic, EOMI, PE R LA, sclera anicteric. Oropharynx; not done Neck; supple, no JVD Cardiovascular; regular rate and rhythm with S1-S2, no murmur rub or gallop Pulmonary; Clear bilaterally Abdomen; soft, protuberant , no muscle tone  nondistended, no palpable mass or hepatosplenomegaly, bowel sounds are active Rectal;not done  Skin; benign exam, no jaundice rash or appreciable lesions Extremities; no clubbing cyanosis or edema skin warm and dry Neuro/Psych; alert and oriented x4, paraplegic       Assessment & Plan:   #74  62 year old white male with remote C6 spinal cord injury and subsequent paraplegia who is wheelchair-bound and has a neurogenic bladder and bowel. He requires ongoing bowel regimen with digital stimulation 2-3 times per week to produce bowel movements.  Over the past few years he has intermittently been told that there is a small amount of blood with cleaning him on the tissue, he feels that this is aggravated by the digital stimulation routine.  He has not had any changes in his bowel habits, no complaints of abdominal pain, no current issues with constipation or diarrhea. Over the past 5  to 6 months he has been using a new device for the digital stimulation and says this seems to be less irritating and there has not been any blood noted over the past couple of months per home health aide.  I think his bleeding is secondary to previously documented internal hemorrhoid on anoscopy and/or just tissue irritation from digital stimulation in setting of chronic anticoagulation  Patient had negative colonoscopy May 2016, has never had any polyps.  #2 chronic anticoagulation on Xarelto #3 history of bilateral DVTs #4.  Dysautonomia  Plan-continue digital stimulation/bowel regimen Check CBC.  Patient says he has physical coming up with his PCP next week and will have CBC done at that time. I do not think he needs repeat colonoscopy at this time, we will plan for follow-up colonoscopy 2026 at the 10-year interval.  Patient requires hospitalization for bowel prep and management of dysautonomia if to undergo colonoscopy.  Will also need to hold Xarelto.  He will follow-up with Dr. Chales Abrahams or myself in 1 year, or sooner as needed.  Have asked him to call should he have any recurrence of rectal bleeding or develop more frequent symptoms or increased volume of low-grade hematochezia.  Patient will be established with Dr. Chales Abrahams in Dr. Larae Grooms absence   Amedeo Detweiler Oswald Hillock PA-C 05/22/2023   Cc: Charlane Ferretti, DO

## 2023-05-22 NOTE — Patient Instructions (Signed)
_______________________________________________________  If your blood pressure at your visit was 140/90 or greater, please contact your primary care physician to follow up on this.  _______________________________________________________  If you are age 60 or older, your body mass index should be between 23-30. Your There is no height or weight on file to calculate BMI. If this is out of the aforementioned range listed, please consider follow up with your Primary Care Provider.  If you are age 22 or younger, your body mass index should be between 19-25. Your There is no height or weight on file to calculate BMI. If this is out of the aformentioned range listed, please consider follow up with your Primary Care Provider.   ________________________________________________________  The [facility] providers would like to encourage you to use [facility] to communicate with providers for non-urgent requests or questions.  Due to long hold times on the telephone, sending your provider a message by [facility] may be a faster and more efficient way to get a response.  Please allow 48 business hours for a response.  Please remember that this is for non-urgent requests.  _______________________________________________________  No changes.  Follow up in our office in 1 year or sooner with Dr Chales Abrahams or Mike Gip, PA.  Please call our office with any issues with rectal bleeding.  Thank you for entrusting me with your care and choosing [facility].  Amy Esterwood, PA-C

## 2023-06-05 ENCOUNTER — Encounter: Payer: Self-pay | Admitting: Podiatry

## 2023-06-05 ENCOUNTER — Ambulatory Visit (INDEPENDENT_AMBULATORY_CARE_PROVIDER_SITE_OTHER): Payer: Medicare Other | Admitting: Podiatry

## 2023-06-05 DIAGNOSIS — B351 Tinea unguium: Secondary | ICD-10-CM | POA: Diagnosis not present

## 2023-06-05 DIAGNOSIS — G6289 Other specified polyneuropathies: Secondary | ICD-10-CM | POA: Diagnosis not present

## 2023-06-05 NOTE — Progress Notes (Signed)
 This patient returns to my office for at risk foot care.  This patient requires this care by a professional since this patient will be at risk due to having due to coagulation defect due to xarelto.  Patient is quadraplegia  and in a wheelchair.This patient is unable to cut nails himself since the patient cannot reach his nails.These nails are painful walking and wearing shoes. This patient presents for at risk foot care today.  General Appearance  Alert, conversant and in no acute stress.  Vascular  Dorsalis pedis and posterior tibial  pulses are palpable  bilaterally.  Capillary return is within normal limits  bilaterally. Temperature is within normal limits  bilaterally.  Neurologic  Senn-Weinstein monofilament wire test within normal limits  bilaterally. Muscle power within normal limits bilaterally.  Nails Thick disfigured discolored nails with subungual debris  from hallux to fifth toes bilaterally. No evidence of bacterial infection or drainage bilaterally.   Orthopedic  No limitations of motion  feet .  No crepitus or effusions noted.  No bony pathology or digital deformities noted.  Skin  normotropic skin with no porokeratosis noted bilaterally.  No signs of infections or ulcers noted.      Onychomycosis  Pain in right toes  Pain in left toes  Consent was obtained for treatment procedures.   Mechanical debridement of nails 1-5  bilaterally performed with a nail nipper.  Filed with dremel without incident.    Return office visit   9 weeks                   Told patient to return for periodic foot care and evaluation due to potential at risk complications.   Gregory Mayer DPM  

## 2023-06-13 NOTE — Progress Notes (Signed)
Agree with assessment/plan.  Raj Gupta, MD [facility] [phone]  

## 2023-07-03 ENCOUNTER — Encounter: Payer: Medicare Other | Admitting: Physical Medicine & Rehabilitation

## 2023-08-07 ENCOUNTER — Encounter: Payer: Medicare Other | Attending: Physical Medicine & Rehabilitation | Admitting: Physical Medicine & Rehabilitation

## 2023-08-07 ENCOUNTER — Encounter: Payer: Self-pay | Admitting: Physical Medicine & Rehabilitation

## 2023-08-07 VITALS — BP 97/60 | HR 48 | Ht 72.0 in

## 2023-08-07 DIAGNOSIS — K592 Neurogenic bowel, not elsewhere classified: Secondary | ICD-10-CM | POA: Diagnosis present

## 2023-08-07 DIAGNOSIS — S14106S Unspecified injury at C6 level of cervical spinal cord, sequela: Secondary | ICD-10-CM | POA: Insufficient documentation

## 2023-08-07 DIAGNOSIS — N319 Neuromuscular dysfunction of bladder, unspecified: Secondary | ICD-10-CM | POA: Insufficient documentation

## 2023-08-07 NOTE — Patient Instructions (Signed)
ALWAYS FEEL FREE TO CALL OUR OFFICE WITH ANY PROBLEMS OR QUESTIONS [phone])  **PLEASE NOTE** ALL MEDICATION REFILL REQUESTS (INCLUDING CONTROLLED SUBSTANCES) NEED TO BE MADE AT LEAST 7 DAYS PRIOR TO REFILL BEING DUE. ANY REFILL REQUESTS INSIDE THAT TIME FRAME MAY RESULT IN DELAYS IN RECEIVING YOUR PRESCRIPTION.

## 2023-08-07 NOTE — Progress Notes (Signed)
Subjective:    Patient ID: Gary Burch, male    DOB: June 16, 1961, 62 y.o.   MRN: [id number]  HPI  Gary Burch is here for his yearly SCI check up. He tells me that things are going pretty well. He has had some general aches and pains. He is reported some hypersensitivity in his left knee/medial shin area. It can be painful with touch or pressure and comes and goes. It's not bothering him currently and overall it's not a major concern.   He also reports that he has a tremor at times with his ADL's. He's worried about a family history of PD (brother).   He follows up with urology. He caths 3 times daily. Bowel program  every other day. His aides help him with program and use a device to help with reflexively empty. He has not had any bleeding  Skin is intact.   His left shoulder can bother him when he sidelies in bed--he sleeps this way because of pressure issues on his backside     Pain Inventory Average Pain 2 Pain Right Now 3 My pain is sharp and tingling  LOCATION OF PAIN  Shoulder, Elbow, Wrist, Back, Buttocks, Knee, Leg, Ankle  BOWEL Number of stools per week: 3  Enema or suppository use  Glycerin   BLADDER  In and out cath, frequency 3x a day Able to self cath  w/ assistance    Mobility ability to climb steps?  no do you drive?  no use a wheelchair needs help with transfers  Function disabled: date disabled . I need assistance with the following:  dressing, bathing, toileting, meal prep, household duties, and shopping  Neuro/Psych tingling  Prior Studies Any changes since last visit?  no  Physicians involved in your care Any changes since last visit?  no   Family History  Problem Relation Age of Onset   Colon polyps Father    Parkinson's disease Father    Alzheimer's disease Father    Hypertension Mother    Heart disease Mother    Social History   Socioeconomic History   Marital status: Single    Spouse name: Not on file   Number of children:  Not on file   Years of education: Not on file   Highest education level: Not on file  Occupational History   Not on file  Tobacco Use   Smoking status: Never   Smokeless tobacco: Never  Vaping Use   Vaping status: Never Used  Substance and Sexual Activity   Alcohol use: Yes    Alcohol/week: 0.0 standard drinks of alcohol    Comment: occasional   Drug use: No   Sexual activity: Not Currently  Other Topics Concern   Not on file  Social History Narrative   Not on file   Social Determinants of Health   Financial Resource Strain: Not on file  Food Insecurity: Not on file  Transportation Needs: Not on file  Physical Activity: Not on file  Stress: Not on file  Social Connections: Not on file   Past Surgical History:  Procedure Laterality Date   COLONOSCOPY WITH PROPOFOL N/A 03/23/2015   Procedure: COLONOSCOPY WITH PROPOFOL;  Surgeon: Rachael Fee, MD;  Location: [facility];  Service: Endoscopy;  Laterality: N/A;   EYE SURGERY     bilateral cataract   GAS/FLUID EXCHANGE Right 11/10/2015   Procedure: GAS/FLUID EXCHANGE- C3F8 10% used;  Surgeon: Edmon Crape, MD;  Location: [facility] OR;  Service: Ophthalmology;  Laterality:  Right;   PARS PLANA VITRECTOMY Right 11/10/2015   Procedure: PARS PLANA VITRECTOMY WITH 25 GAUGE with endolaser ;  Surgeon: Edmon Crape, MD;  Location: [facility] OR;  Service: Ophthalmology;  Laterality: Right;   RETINAL DETACHMENT SURGERY  09/20/2015   RETINAL TEAR REPAIR CRYOTHERAPY     RETINAL TEAR REPAIR CRYOTHERAPY  09/2021   left repair   SPINE SURGERY  11/20/1979   fusion   TONSILLECTOMY     Past Medical History:  Diagnosis Date   Cataracts, bilateral    Cellulitis 01/2013   left leg   DVT (deep venous thrombosis) (HCC)    Elevated liver enzymes    " once, as a response to medication"   Glaucoma    Heart murmur    Kidney stone    Pneumonia    Retinal tear    bilateral   Spine injury    UTI (lower urinary tract infection)    Vertigo    BP  97/60   Pulse (!) 48   Ht 6' (1.829 m)   SpO2 97%   BMI 22.38 kg/m   Opioid Risk Score:   Fall Risk Score:  `1  Depression screen PHQ 2/9     08/07/2023    1:00 PM 07/04/2022   12:50 PM 07/05/2021    1:11 PM 07/06/2020    1:52 PM 07/08/2019    1:54 PM  Depression screen PHQ 2/9  Decreased Interest 0 0 0 0 0  Down, Depressed, Hopeless 0 0 0 0 0  PHQ - 2 Score 0 0 0 0 0      Review of Systems  Musculoskeletal:  Positive for back pain and gait problem.       Shoulder, Elbow, Wrist, Buttock, Knee, Leg, Ankle pain  All other systems reviewed and are negative.     Objective:   Physical Exam  General: No acute distress HEENT: NCAT, EOMI, oral membranes moist Cards: reg rate  Chest: normal effort Abdomen: Soft, NT, ND Skin: dry, intact Extremities: no edema Psych: pleasant and appropriate   Skin: dry, intact. keratoses on back  Musculoskeletal: elbow ROM normal. Left shoulder only tender with end ROM in IR. Left knee without tenderness, no jt line pain, No effusion Neurological: He is alert and oriented to person, place, and time. nystagmus with gaze to left only. Consistent weakness below the C6 level is consistent. I see no consistent tremors, rigidity, or cognitive deficits. DTR's 3+  continued clonus in LLE > RLE-no change,         Assessment & Plan:   1. C6 spinal cord injury, complete.  2. Right lateral epicondylitis with ulnar nerve irritation, involving left too 3. Right rotator cuff syndrome  4. Neurogenic bowel and bladder  5. Thrombocytopenia: likely due to lyrica (delayed response).  6. Bilateral LE DVT's     Plan:  1. Continue xarelto for  life long anticoagulation.  2. Discussed oblique lying in bed potentially vs entire sidelying.  4. I suspect left knee/shin symptoms are neuropathic. Exam is benign today.  5. Bladder per urology. continue I/O caths BID to TID 6. Chair fitting appropriatley     15 minutes of face to face patient care time were  spent during this visit. All questions were encouraged and answered.  Follow up with me in 1 year .

## 2023-08-14 ENCOUNTER — Encounter: Payer: Self-pay | Admitting: Podiatry

## 2023-08-14 ENCOUNTER — Ambulatory Visit (INDEPENDENT_AMBULATORY_CARE_PROVIDER_SITE_OTHER): Payer: Medicare Other | Admitting: Podiatry

## 2023-08-14 DIAGNOSIS — D689 Coagulation defect, unspecified: Secondary | ICD-10-CM | POA: Diagnosis not present

## 2023-08-14 DIAGNOSIS — B351 Tinea unguium: Secondary | ICD-10-CM

## 2023-08-14 DIAGNOSIS — G6289 Other specified polyneuropathies: Secondary | ICD-10-CM

## 2023-08-14 NOTE — Progress Notes (Signed)
This patient returns to my office for at risk foot care.  This patient requires this care by a professional since this patient will be at risk due to having due to coagulation defect due to xarelto.  Patient is quadraplegia  and in a wheelchair. This patient is unable to cut nails himself since the patient cannot reach his nails.These nails are painful walking and wearing shoes.  This patient presents for at risk foot care today.  General Appearance  Alert, conversant and in no acute stress.  Vascular  Dorsalis pedis and posterior tibial  pulses are palpable  bilaterally.  Capillary return is within normal limits  bilaterally. Temperature is within normal limits  bilaterally.  Neurologic  Senn-Weinstein monofilament wire test within normal limits  bilaterally. Muscle power within normal limits bilaterally.  Nails Thick disfigured discolored nails with subungual debris  from hallux to fifth toes bilaterally. No evidence of bacterial infection or drainage bilaterally.  Orthopedic  No limitations of motion  feet .  No crepitus or effusions noted.  No bony pathology or digital deformities noted.  Skin  normotropic skin with no porokeratosis noted bilaterally.  No signs of infections or ulcers noted.     Onychomycosis  Pain in right toes  Pain in left toes  Consent was obtained for treatment procedures.   Mechanical debridement of nails 1-5  bilaterally performed with a nail nipper.  Filed with dremel without incident.    Return office visit   9 weeks                   Told patient to return for periodic foot care and evaluation due to potential at risk complications.   Helane Gunther DPM

## 2023-10-16 ENCOUNTER — Ambulatory Visit: Payer: Medicare Other | Admitting: Podiatry

## 2023-10-16 ENCOUNTER — Encounter: Payer: Self-pay | Admitting: Podiatry

## 2023-10-16 DIAGNOSIS — B351 Tinea unguium: Secondary | ICD-10-CM | POA: Diagnosis not present

## 2023-10-16 NOTE — Progress Notes (Signed)
This patient returns to my office for at risk foot care.  This patient requires this care by a professional since this patient will be at risk due to having due to coagulation defect due to xarelto.  Patient is quadraplegia  and in a wheelchair. This patient is unable to cut nails himself since the patient cannot reach his nails.These nails are painful walking and wearing shoes.  This patient presents for at risk foot care today.  General Appearance  Alert, conversant and in no acute stress.  Vascular  Dorsalis pedis and posterior tibial  pulses are palpable  bilaterally.  Capillary return is within normal limits  bilaterally. Temperature is within normal limits  bilaterally.  Neurologic  Senn-Weinstein monofilament wire test within normal limits  bilaterally. Muscle power within normal limits bilaterally.  Nails Thick disfigured discolored nails with subungual debris  from hallux to fifth toes bilaterally. No evidence of bacterial infection or drainage bilaterally.  Orthopedic  No limitations of motion  feet .  No crepitus or effusions noted.  No bony pathology or digital deformities noted.  Skin  normotropic skin with no porokeratosis noted bilaterally.  No signs of infections or ulcers noted.     Onychomycosis  Pain in right toes  Pain in left toes  Consent was obtained for treatment procedures.   Mechanical debridement of nails 1-5  bilaterally performed with a nail nipper.  Filed with dremel without incident.    Return office visit   9 weeks                   Told patient to return for periodic foot care and evaluation due to potential at risk complications.   Helane Gunther DPM

## 2023-12-25 ENCOUNTER — Encounter: Payer: Self-pay | Admitting: Podiatry

## 2023-12-25 ENCOUNTER — Ambulatory Visit (INDEPENDENT_AMBULATORY_CARE_PROVIDER_SITE_OTHER): Payer: Medicare Other | Admitting: Podiatry

## 2023-12-25 DIAGNOSIS — G6289 Other specified polyneuropathies: Secondary | ICD-10-CM

## 2023-12-25 DIAGNOSIS — B351 Tinea unguium: Secondary | ICD-10-CM | POA: Diagnosis not present

## 2023-12-25 NOTE — Progress Notes (Signed)
 This patient returns to my office for at risk foot care.  This patient requires this care by a professional since this patient will be at risk due to having due to coagulation defect due to xarelto.  Patient is quadraplegia  and in a wheelchair. This patient is unable to cut nails himself since the patient cannot reach his nails.These nails are painful walking and wearing shoes.  This patient presents for at risk foot care today.  General Appearance  Alert, conversant and in no acute stress.  Vascular  Dorsalis pedis and posterior tibial  pulses are palpable  bilaterally.  Capillary return is within normal limits  bilaterally. Temperature is within normal limits  bilaterally.  Neurologic  Senn-Weinstein monofilament wire test within normal limits  bilaterally. Muscle power within normal limits bilaterally.  Nails Thick disfigured discolored nails with subungual debris  from hallux to fifth toes bilaterally. No evidence of bacterial infection or drainage bilaterally.  Orthopedic  No limitations of motion  feet .  No crepitus or effusions noted.  No bony pathology or digital deformities noted.  Skin  normotropic skin with no porokeratosis noted bilaterally.  No signs of infections or ulcers noted.     Onychomycosis  Pain in right toes  Pain in left toes  Consent was obtained for treatment procedures.   Mechanical debridement of nails 1-5  bilaterally performed with a nail nipper.  Filed with dremel without incident.    Return office visit   9 weeks                   Told patient to return for periodic foot care and evaluation due to potential at risk complications.   Helane Gunther DPM

## 2024-02-04 ENCOUNTER — Telehealth: Payer: Self-pay

## 2024-02-04 DIAGNOSIS — G8250 Quadriplegia, unspecified: Secondary | ICD-10-CM

## 2024-02-05 ENCOUNTER — Telehealth: Payer: Self-pay | Admitting: *Deleted

## 2024-02-05 NOTE — Progress Notes (Signed)
 Complex Care Management Note  Care Guide Note 02/05/2024 Name: [name] MRN: [id number] DOB: [birth year]  Gary Burch is a 63 y.o. year old male who sees Charlane Ferretti, [place] for primary care. I reached out to Gary Burch by phone today to offer complex care management services.  [name] was given information about Complex Care Management services today including:   The Complex Care Management services include support from the care team which includes your Nurse Care Manager, Clinical Social Worker, or Pharmacist.  The Complex Care Management team is here to help remove barriers to the health concerns and goals most important to you. Complex Care Management services are voluntary, and the patient may decline or stop services at any time by request to their care team member.   Complex Care Management Consent Status: Patient agreed to services and verbal consent obtained.   Follow up plan:  Telephone appointment with complex care management team member scheduled for:  02/12/2024  Encounter Outcome:  Patient Scheduled  Burman Nieves, CMA, Care Guide [facility]  [facility], [facility] Guide Direct Dial: [phone]  Fax: [phone] Website: [email or web address]

## 2024-02-12 ENCOUNTER — Telehealth: Payer: Self-pay | Admitting: *Deleted

## 2024-02-12 ENCOUNTER — Ambulatory Visit: Payer: Self-pay | Admitting: *Deleted

## 2024-02-12 NOTE — Patient Outreach (Signed)
 Care Coordination   02/12/2024 Name: Gary Burch MRN: [id number] DOB: Sep 12, 1961   Care Coordination Outreach Attempts:  An unsuccessful telephone outreach was attempted today to offer the patient information about available complex care management services.  Follow Up Plan:  Additional outreach attempts will be made to offer the patient complex care management information and services.   Encounter Outcome:  No Answer   Care Coordination Interventions:  No, not indicated    Keighley Deckman, LCSW [place]  [facility], [facility] Licensed Clinical Social Worker Care Coordinator  Direct Dial: [phone]

## 2024-02-13 NOTE — Patient Outreach (Signed)
 Care Coordination   Initial Visit Note   02/13/2024 Late Entry Name: Gary Burch MRN: [id number] DOB: 1961-03-05  Gary Burch is a 63 y.o. year old male who sees Charlane Ferretti, [place] for primary care. I spoke with  Gary Burch by phone on 02/12/24.  What matters to the patients health and wellness today?  Patient has been  residing with his brother and sister in law for the last 28 years. Both brother and sister in law have their own medical challenges however their condition has improved and placement is not needed at this time.  Patient would like to know his options if placement is ever needed in the future. Confirms residing with them for the last 28 years. Patient currently receives in home care from the [facility] and uses [facility] for transportation. Placement process discussed facility list emailed to patient for review [email or web address]   Goals Addressed             This Visit's Progress    Placement options-Assisted Living       Contact your Community Alternative Program social worker through the Department of Social Services to discuss and coordinate possible  placement in long term care Review facilities from the list provided call and go visit to determine choice Contact this Child psychotherapist with any additional community resource needs         SDOH assessments and interventions completed:  Yes  SDOH Interventions Today    Flowsheet Row Most Recent Value  SDOH Interventions   Food Insecurity Interventions Intervention Not Indicated  Housing Interventions Intervention Not Indicated  Transportation Interventions [facility] ([facility])  Utilities Interventions Intervention Not Indicated        Care Coordination Interventions:  Yes, provided  Interventions Today    Flowsheet Row Most Recent Value  Chronic Disease   Chronic disease during today's visit Other  [Quadriplegia]  General Interventions   General  Interventions Discussed/Reviewed General Interventions Discussed, Copy assessed for community resources/placement needs]  Level of Care Assisted Living, Personal Care Services  [discussed need for potential placement in the event that he can no longer reside at current residence/ Patient active with the [employer] [facility] through the Dept of Social Services and receives personal care 2x per day 49 hours  per month]  Education Interventions   Education Provided Provided Education  Provided Verbal Education On [employer] process and need to apply for long term care Medicaid once placement decision is made]       Follow up plan: No further intervention required.   Encounter Outcome:  Patient Visit Completed

## 2024-02-13 NOTE — Patient Instructions (Signed)
 Visit Information  Thank you for taking time to visit with me today. Please don't hesitate to contact me if I can be of assistance to you.   Following are the goals we discussed today:   Goals Addressed             This Visit's Progress    Placement options-Assisted Living       Contact your Community Alternative Program social worker through the Department of Social Services to discuss and coordinate possible  placement in long term care Review facilities from the list provided call and go visit to determine choice Contact this Child psychotherapist with any additional community resource needs        If you are experiencing a Mental Health or Behavioral Health Crisis or need someone to talk to, please call 911   Patient verbalizes understanding of instructions and care plan provided today and agrees to view in [place]. Active MyChart status and patient understanding of how to access instructions and care plan via MyChart confirmed with patient.     No further follow up required: patient to contact this Child psychotherapist with any additional community resource needs  [employer], [employer] [place]  [facility], [facility] Licensed Clinical Social Geologist, engineering Dial: [phone]

## 2024-02-27 ENCOUNTER — Ambulatory Visit: Payer: Medicare Other | Admitting: Podiatry

## 2024-02-28 ENCOUNTER — Ambulatory Visit: Payer: Medicare Other | Admitting: Podiatry

## 2024-03-02 ENCOUNTER — Ambulatory Visit (INDEPENDENT_AMBULATORY_CARE_PROVIDER_SITE_OTHER): Admitting: Podiatry

## 2024-03-02 ENCOUNTER — Encounter: Payer: Self-pay | Admitting: Podiatry

## 2024-03-02 DIAGNOSIS — G6289 Other specified polyneuropathies: Secondary | ICD-10-CM

## 2024-03-02 DIAGNOSIS — B351 Tinea unguium: Secondary | ICD-10-CM | POA: Diagnosis not present

## 2024-03-02 NOTE — Progress Notes (Signed)
 This patient returns to my office for at risk foot care.  This patient requires this care by a professional since this patient will be at risk due to having due to coagulation defect due to xarelto.  Patient is quadraplegia  and in a wheelchair. This patient is unable to cut nails himself since the patient cannot reach his nails.These nails are painful walking and wearing shoes.  This patient presents for at risk foot care today.  General Appearance  Alert, conversant and in no acute stress.  Vascular  Dorsalis pedis and posterior tibial  pulses are palpable  bilaterally.  Capillary return is within normal limits  bilaterally. Temperature is within normal limits  bilaterally.  Neurologic  Senn-Weinstein monofilament wire test within normal limits  bilaterally. Muscle power within normal limits bilaterally.  Nails Thick disfigured discolored nails with subungual debris  from hallux to fifth toes bilaterally. No evidence of bacterial infection or drainage bilaterally.  Orthopedic  No limitations of motion  feet .  No crepitus or effusions noted.  No bony pathology or digital deformities noted.  Skin  normotropic skin with no porokeratosis noted bilaterally.  No signs of infections or ulcers noted.     Onychomycosis  Pain in right toes  Pain in left toes  Consent was obtained for treatment procedures.   Mechanical debridement of nails 1-5  bilaterally performed with a nail nipper.  Filed with dremel without incident.    Return office visit   9 weeks                   Told patient to return for periodic foot care and evaluation due to potential at risk complications.   Helane Gunther DPM

## 2024-05-06 ENCOUNTER — Encounter: Payer: Self-pay | Admitting: Podiatry

## 2024-05-06 ENCOUNTER — Ambulatory Visit (INDEPENDENT_AMBULATORY_CARE_PROVIDER_SITE_OTHER): Admitting: Podiatry

## 2024-05-06 DIAGNOSIS — B351 Tinea unguium: Secondary | ICD-10-CM

## 2024-05-06 DIAGNOSIS — G6289 Other specified polyneuropathies: Secondary | ICD-10-CM

## 2024-05-06 NOTE — Progress Notes (Signed)
 This patient returns to my office for at risk foot care.  This patient requires this care by a professional since this patient will be at risk due to having due to coagulation defect due to xarelto.  Patient is quadraplegia  and in a wheelchair. This patient is unable to cut nails himself since the patient cannot reach his nails.These nails are painful walking and wearing shoes.  This patient presents for at risk foot care today.  General Appearance  Alert, conversant and in no acute stress.  Vascular  Dorsalis pedis and posterior tibial  pulses are palpable  bilaterally.  Capillary return is within normal limits  bilaterally. Temperature is within normal limits  bilaterally.  Neurologic  Senn-Weinstein monofilament wire test within normal limits  bilaterally. Muscle power within normal limits bilaterally.  Nails Thick disfigured discolored nails with subungual debris  from hallux to fifth toes bilaterally. No evidence of bacterial infection or drainage bilaterally.  Orthopedic  No limitations of motion  feet .  No crepitus or effusions noted.  No bony pathology or digital deformities noted.  Skin  normotropic skin with no porokeratosis noted bilaterally.  No signs of infections or ulcers noted.     Onychomycosis  Pain in right toes  Pain in left toes  Consent was obtained for treatment procedures.   Mechanical debridement of nails 1-5  bilaterally performed with a nail nipper.  Filed with dremel without incident.    Return office visit   9 weeks                   Told patient to return for periodic foot care and evaluation due to potential at risk complications.   Helane Gunther DPM

## 2024-05-11 ENCOUNTER — Ambulatory Visit: Admitting: Podiatry

## 2024-07-14 ENCOUNTER — Ambulatory Visit: Admitting: Podiatry

## 2024-07-14 ENCOUNTER — Encounter: Payer: Self-pay | Admitting: Podiatry

## 2024-07-14 DIAGNOSIS — B351 Tinea unguium: Secondary | ICD-10-CM | POA: Diagnosis not present

## 2024-07-14 DIAGNOSIS — M79676 Pain in unspecified toe(s): Secondary | ICD-10-CM | POA: Diagnosis not present

## 2024-07-14 DIAGNOSIS — S14106S Unspecified injury at C6 level of cervical spinal cord, sequela: Secondary | ICD-10-CM

## 2024-07-14 NOTE — Progress Notes (Signed)
 This patient returns to my office for at risk foot care.  This patient requires this care by a professional since this patient will be at risk due to having due to coagulation defect due to xarelto.  Patient is quadraplegia  and in a wheelchair. This patient is unable to cut nails himself since the patient cannot reach his nails.These nails are painful walking and wearing shoes.  This patient presents for at risk foot care today.  General Appearance  Alert, conversant and in no acute stress.  Vascular  Dorsalis pedis and posterior tibial  pulses are palpable  bilaterally.  Capillary return is within normal limits  bilaterally. Temperature is within normal limits  bilaterally.  Neurologic  Senn-Weinstein monofilament wire test within normal limits  bilaterally. Muscle power within normal limits bilaterally.  Nails Thick disfigured discolored nails with subungual debris  from hallux to fifth toes bilaterally. No evidence of bacterial infection or drainage bilaterally.  Orthopedic  No limitations of motion  feet .  No crepitus or effusions noted.  No bony pathology or digital deformities noted.  Skin  normotropic skin with no porokeratosis noted bilaterally.  No signs of infections or ulcers noted.     Onychomycosis  Pain in right toes  Pain in left toes  Consent was obtained for treatment procedures.   Mechanical debridement of nails 1-5  bilaterally performed with a nail nipper.  Filed with dremel without incident.    Return office visit   9 weeks                   Told patient to return for periodic foot care and evaluation due to potential at risk complications.   Helane Gunther DPM

## 2024-08-05 ENCOUNTER — Encounter: Payer: Self-pay | Admitting: Physical Medicine & Rehabilitation

## 2024-08-05 ENCOUNTER — Encounter: Payer: Medicare Other | Attending: Physical Medicine & Rehabilitation | Admitting: Physical Medicine & Rehabilitation

## 2024-08-05 VITALS — BP 146/76 | HR 48 | Ht 72.0 in

## 2024-08-05 DIAGNOSIS — S14106S Unspecified injury at C6 level of cervical spinal cord, sequela: Secondary | ICD-10-CM | POA: Insufficient documentation

## 2024-08-05 NOTE — Progress Notes (Signed)
 Subjective:    Patient ID: Gary Burch, male    DOB: 1961/07/16, 63 y.o.   MRN: [id number]  HPI  Abran is here in follow up of his C6 SCI. He has been doing fairly well this past year. He reports no changes with his bladder and bowels. He has used digital stim with results. He has used a magic bullet with success on an occasion or two. Caths are two and three x per day. The second cath is a bit early at 0930 but he doesn't have a lot of options. He hasn't had any UTI's recently.    Skin is intact. He has some occasional irritations related his bowel program.   Pain levels are controlled. He has nerve tingling intermittently from compression at his elbows. Some tingling from shoulders.    Pain Inventory Average Pain 2 Pain Right Now 2 My pain is sharp and tingling  In the last 24 hours, has pain interfered with the following? General activity 2 Relation with others 2 Enjoyment of life 2 What TIME of day is your pain at its worst? daytime Sleep (in general) Fair  Pain is worse with: sitting and inactivity Pain improves with: rest and heat/ice Relief from Meds: 2  Family History  Problem Relation Age of Onset   Colon polyps Father    Parkinson's disease Father    Alzheimer's disease Father    Hypertension Mother    Heart disease Mother    Social History   Socioeconomic History   Marital status: Single    Spouse name: Not on file   Number of children: Not on file   Years of education: Not on file   Highest education level: Not on file  Occupational History   Not on file  Tobacco Use   Smoking status: Never   Smokeless tobacco: Never  Vaping Use   Vaping status: Never Used  Substance and Sexual Activity   Alcohol use: Yes    Alcohol/week: 0.0 standard drinks of alcohol    Comment: occasional   Drug use: No   Sexual activity: Not Currently  Other Topics Concern   Not on file  Social History Narrative   Not on file   Social Drivers of Health   Financial  Resource Strain: Not on file  Food Insecurity: No Food Insecurity (02/12/2024)   Hunger Vital Sign    Worried About Running Out of Food in the Last Year: Never true    Ran Out of Food in the Last Year: Never true  Transportation Needs: No Transportation Needs (02/12/2024)   PRAPARE - Administrator, Civil Service (Medical): No    Lack of Transportation (Non-Medical): No  Physical Activity: Not on file  Stress: Not on file  Social Connections: Not on file   Past Surgical History:  Procedure Laterality Date   COLONOSCOPY WITH PROPOFOL  N/A 03/23/2015   Procedure: COLONOSCOPY WITH PROPOFOL ;  Surgeon: Toribio SHAUNNA Cedar, MD;  Location: [facility];  Service: Endoscopy;  Laterality: N/A;   EYE SURGERY     bilateral cataract   GAS/FLUID EXCHANGE Right 11/10/2015   Procedure: GAS/FLUID EXCHANGE- C3F8 10% used;  Surgeon: Arley DELENA Ruder, MD;  Location: [facility] OR;  Service: Ophthalmology;  Laterality: Right;   PARS PLANA VITRECTOMY Right 11/10/2015   Procedure: PARS PLANA VITRECTOMY WITH 25 GAUGE with endolaser ;  Surgeon: Arley DELENA Ruder, MD;  Location: [facility] OR;  Service: Ophthalmology;  Laterality: Right;   RETINAL DETACHMENT SURGERY  09/20/2015  RETINAL TEAR REPAIR CRYOTHERAPY     RETINAL TEAR REPAIR CRYOTHERAPY  09/2021   left repair   SPINE SURGERY  11/20/1979   fusion   TONSILLECTOMY     Past Surgical History:  Procedure Laterality Date   COLONOSCOPY WITH PROPOFOL  N/A 03/23/2015   Procedure: COLONOSCOPY WITH PROPOFOL ;  Surgeon: Toribio SHAUNNA Cedar, MD;  Location: [facility];  Service: Endoscopy;  Laterality: N/A;   EYE SURGERY     bilateral cataract   GAS/FLUID EXCHANGE Right 11/10/2015   Procedure: GAS/FLUID EXCHANGE- C3F8 10% used;  Surgeon: Arley DELENA Ruder, MD;  Location: [facility] OR;  Service: Ophthalmology;  Laterality: Right;   PARS PLANA VITRECTOMY Right 11/10/2015   Procedure: PARS PLANA VITRECTOMY WITH 25 GAUGE with endolaser ;  Surgeon: Arley DELENA Ruder, MD;  Location: [facility] OR;  Service:  Ophthalmology;  Laterality: Right;   RETINAL DETACHMENT SURGERY  09/20/2015   RETINAL TEAR REPAIR CRYOTHERAPY     RETINAL TEAR REPAIR CRYOTHERAPY  09/2021   left repair   SPINE SURGERY  11/20/1979   fusion   TONSILLECTOMY     Past Medical History:  Diagnosis Date   Cataracts, bilateral    Cellulitis 01/2013   left leg   DVT (deep venous thrombosis) (HCC)    Elevated liver enzymes     once, as a response to medication   Glaucoma    Heart murmur    Kidney stone    Pneumonia    Retinal tear    bilateral   Spine injury    UTI (lower urinary tract infection)    Vertigo    BP (!) 146/76   Pulse (!) 48   Ht 6' (1.829 m)   SpO2 95%   BMI 22.38 kg/m   Opioid Risk Score:   Fall Risk Score:  `1  Depression screen PHQ 2/9     08/05/2024    1:10 PM 02/12/2024   12:20 PM 08/07/2023    1:00 PM 07/04/2022   12:50 PM 07/05/2021    1:11 PM 07/06/2020    1:52 PM 07/08/2019    1:54 PM  Depression screen PHQ 2/9  Decreased Interest 0 0 0 0 0 0 0  Down, Depressed, Hopeless 0 0 0 0 0 0 0  PHQ - 2 Score 0 0 0 0 0 0 0    Review of Systems  All other systems reviewed and are negative.      Objective:   Physical Exam General: No acute distress HEENT: NCAT, EOMI, oral membranes moist Cards: reg rate  Chest: normal effort Abdomen: Soft, NT, ND Skin: dry, intact Extremities: no edema Psych: pleasant and appropriate  Skin: dry, intact. keratoses on back  Musculoskeletal: elbow ROM normal. Left shoulder only tender with end ROM in IR. Left knee without tenderness, no jt line pain, No effusion Neurological: He is alert and oriented to person, place, and time. nystagmus with gaze to left only. Consistent weakness below the C6 level is consistent.  Extends both elbows and flexes wrists. There are no consistent tremors, rigidity, or cognitive deficits. DTR's 3+  continued clonus in LLE > RLE-only 1-2 beats today          Assessment & Plan:   1. C6 spinal cord injury, complete.   2. Right lateral epicondylitis with ulnar nerve irritation, involving left too 3. Right rotator cuff syndrome  4. Neurogenic bowel and bladder  5. Thrombocytopenia: likely due to lyrica  (delayed response).  6. Bilateral LE DVT's     Plan:  1. Continue xarelto for  life long anticoagulation.  2. positional awareness for upper extremites, avoid compression when possible.   4. Magic bullet suppository, intermittently when dig stim not an option  5. Bladder per urology. continue I/O caths BID to TID. Does he still want to take urecholine ? TRY WEANING to 25mg  tid to start 6. Chair fitting appropriatley. Padding sufficient      15 minutes of face to face patient care time were spent during this visit. All questions were encouraged and answered.  Follow up with me in 1 year .

## 2024-08-05 NOTE — Patient Instructions (Addendum)
 ALWAYS FEEL FREE TO CALL OUR OFFICE WITH ANY PROBLEMS OR QUESTIONS [phone])  **PLEASE NOTE** ALL MEDICATION REFILL REQUESTS (INCLUDING CONTROLLED SUBSTANCES) NEED TO BE MADE AT LEAST 7 DAYS PRIOR TO REFILL BEING DUE. ANY REFILL REQUESTS INSIDE THAT TIME FRAME MAY RESULT IN DELAYS IN RECEIVING YOUR PRESCRIPTION.     CONSIDER DECREASING URECHOLINE  TO 25MG  THREE X DAILY.

## 2024-09-16 ENCOUNTER — Encounter: Payer: Self-pay | Admitting: Podiatry

## 2024-09-16 ENCOUNTER — Ambulatory Visit: Admitting: Podiatry

## 2024-09-16 DIAGNOSIS — B351 Tinea unguium: Secondary | ICD-10-CM

## 2024-09-16 DIAGNOSIS — G6289 Other specified polyneuropathies: Secondary | ICD-10-CM | POA: Diagnosis not present

## 2024-09-16 NOTE — Progress Notes (Signed)
 This patient returns to my office for at risk foot care.  This patient requires this care by a professional since this patient will be at risk due to having due to coagulation defect due to xarelto.  Patient is quadraplegia  and in a wheelchair. This patient is unable to cut nails himself since the patient cannot reach his nails.These nails are painful walking and wearing shoes.  This patient presents for at risk foot care today.  General Appearance  Alert, conversant and in no acute stress.  Vascular  Dorsalis pedis and posterior tibial  pulses are palpable  bilaterally.  Capillary return is within normal limits  bilaterally. Temperature is within normal limits  bilaterally.  Neurologic  Senn-Weinstein monofilament wire test within normal limits  bilaterally. Muscle power within normal limits bilaterally.  Nails Thick disfigured discolored nails with subungual debris  from hallux to fifth toes bilaterally. No evidence of bacterial infection or drainage bilaterally.  Orthopedic  No limitations of motion  feet .  No crepitus or effusions noted.  No bony pathology or digital deformities noted.  Skin  normotropic skin with no porokeratosis noted bilaterally.  No signs of infections or ulcers noted.     Onychomycosis  Pain in right toes  Pain in left toes  Consent was obtained for treatment procedures.   Mechanical debridement of nails 1-5  bilaterally performed with a nail nipper.  Filed with dremel without incident.    Return office visit   9 weeks                   Told patient to return for periodic foot care and evaluation due to potential at risk complications.   Helane Gunther DPM

## 2024-12-01 ENCOUNTER — Ambulatory Visit: Admitting: Podiatry

## 2024-12-01 ENCOUNTER — Encounter: Payer: Self-pay | Admitting: Podiatry

## 2024-12-01 DIAGNOSIS — M79674 Pain in right toe(s): Secondary | ICD-10-CM

## 2024-12-01 DIAGNOSIS — G8253 Quadriplegia, C5-C7 complete: Secondary | ICD-10-CM

## 2024-12-01 DIAGNOSIS — B351 Tinea unguium: Secondary | ICD-10-CM | POA: Diagnosis not present

## 2024-12-01 DIAGNOSIS — M79675 Pain in left toe(s): Secondary | ICD-10-CM

## 2024-12-01 DIAGNOSIS — S14106S Unspecified injury at C6 level of cervical spinal cord, sequela: Secondary | ICD-10-CM

## 2024-12-01 DIAGNOSIS — G6289 Other specified polyneuropathies: Secondary | ICD-10-CM

## 2024-12-01 NOTE — Progress Notes (Signed)
 This patient returns to my office for at risk foot care.  This patient requires this care by a professional since this patient will be at risk due to having due to coagulation defect due to xarelto.  Patient is quadraplegia  and in a wheelchair. This patient is unable to cut nails himself since the patient cannot reach his nails.These nails are painful walking and wearing shoes.  This patient presents for at risk foot care today.  General Appearance  Alert, conversant and in no acute stress.  Vascular  Dorsalis pedis and posterior tibial  pulses are palpable  bilaterally.  Capillary return is within normal limits  bilaterally. Temperature is within normal limits  bilaterally.  Neurologic  Senn-Weinstein monofilament wire test within normal limits  bilaterally. Muscle power within normal limits bilaterally.  Nails Thick disfigured discolored nails with subungual debris  from hallux to fifth toes bilaterally. No evidence of bacterial infection or drainage bilaterally.  Orthopedic  No limitations of motion  feet .  No crepitus or effusions noted.  No bony pathology or digital deformities noted.  Skin  normotropic skin with no porokeratosis noted bilaterally.  No signs of infections or ulcers noted.     Onychomycosis  Pain in right toes  Pain in left toes  Consent was obtained for treatment procedures.   Mechanical debridement of nails 1-5  bilaterally performed with a nail nipper.  Filed with dremel without incident.    Return office visit   9 weeks                   Told patient to return for periodic foot care and evaluation due to potential at risk complications.   Helane Gunther DPM

## 2025-02-04 ENCOUNTER — Ambulatory Visit: Admitting: Podiatry

## 2025-08-04 ENCOUNTER — Ambulatory Visit: Admitting: Physical Medicine & Rehabilitation
